# Patient Record
Sex: Male | Born: 1980 | Race: Black or African American | Hispanic: No | Marital: Single | Smoking: Current some day smoker
Health system: Southern US, Community
[De-identification: ages and names within clinical notes are randomized; demographics above are authoritative.]

## PROBLEM LIST (undated history)

## (undated) DIAGNOSIS — F32A Depression, unspecified: Secondary | ICD-10-CM

## (undated) DIAGNOSIS — F419 Anxiety disorder, unspecified: Secondary | ICD-10-CM

## (undated) DIAGNOSIS — J45909 Unspecified asthma, uncomplicated: Secondary | ICD-10-CM

## (undated) DIAGNOSIS — F329 Major depressive disorder, single episode, unspecified: Secondary | ICD-10-CM

## (undated) DIAGNOSIS — K529 Noninfective gastroenteritis and colitis, unspecified: Secondary | ICD-10-CM

## (undated) DIAGNOSIS — K5140 Inflammatory polyps of colon without complications: Principal | ICD-10-CM

## (undated) DIAGNOSIS — R109 Unspecified abdominal pain: Secondary | ICD-10-CM

---
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
## 2000-12-14 ENCOUNTER — Encounter: Payer: Self-pay | Admitting: Emergency Medicine

## 2000-12-14 ENCOUNTER — Emergency Department (HOSPITAL_COMMUNITY): Admission: EM | Admit: 2000-12-14 | Discharge: 2000-12-14 | Payer: Self-pay | Admitting: Emergency Medicine

## 2002-05-08 ENCOUNTER — Encounter: Payer: Self-pay | Admitting: Emergency Medicine

## 2002-05-08 ENCOUNTER — Encounter: Payer: Self-pay | Admitting: Internal Medicine

## 2002-05-08 ENCOUNTER — Inpatient Hospital Stay (HOSPITAL_COMMUNITY): Admission: EM | Admit: 2002-05-08 | Discharge: 2002-05-10 | Payer: Self-pay | Admitting: Emergency Medicine

## 2002-05-09 ENCOUNTER — Encounter: Payer: Self-pay | Admitting: Cardiology

## 2002-05-13 ENCOUNTER — Emergency Department (HOSPITAL_COMMUNITY): Admission: EM | Admit: 2002-05-13 | Discharge: 2002-05-14 | Payer: Self-pay | Admitting: Emergency Medicine

## 2002-05-14 ENCOUNTER — Encounter: Payer: Self-pay | Admitting: Emergency Medicine

## 2003-03-29 ENCOUNTER — Emergency Department (HOSPITAL_COMMUNITY): Admission: EM | Admit: 2003-03-29 | Discharge: 2003-03-29 | Payer: Self-pay | Admitting: Emergency Medicine

## 2003-04-02 ENCOUNTER — Emergency Department (HOSPITAL_COMMUNITY): Admission: EM | Admit: 2003-04-02 | Discharge: 2003-04-02 | Payer: Self-pay | Admitting: *Deleted

## 2003-10-30 ENCOUNTER — Emergency Department (HOSPITAL_COMMUNITY): Admission: EM | Admit: 2003-10-30 | Discharge: 2003-10-30 | Payer: Self-pay | Admitting: Emergency Medicine

## 2004-03-18 ENCOUNTER — Emergency Department (HOSPITAL_COMMUNITY): Admission: EM | Admit: 2004-03-18 | Discharge: 2004-03-18 | Payer: Self-pay | Admitting: Emergency Medicine

## 2004-05-13 ENCOUNTER — Emergency Department (HOSPITAL_COMMUNITY): Admission: EM | Admit: 2004-05-13 | Discharge: 2004-05-14 | Payer: Self-pay | Admitting: Emergency Medicine

## 2008-05-26 ENCOUNTER — Emergency Department (HOSPITAL_COMMUNITY): Admission: EM | Admit: 2008-05-26 | Discharge: 2008-05-26 | Payer: Self-pay | Admitting: Emergency Medicine

## 2010-06-04 LAB — URINALYSIS, ROUTINE W REFLEX MICROSCOPIC
Glucose, UA: NEGATIVE mg/dL
Hgb urine dipstick: NEGATIVE
Ketones, ur: >80 mg/dL — AB
Leukocytes, UA: NEGATIVE
Protein, ur: 100 mg/dL — AB
pH: 6 (ref 5.0–8.0)

## 2010-06-04 LAB — URINE MICROSCOPIC-ADD ON

## 2010-07-11 NOTE — Consult Note (Signed)
Mike Greer, Mike Greer                          ACCOUNT NO.:  [id number]   MEDICAL RECORD NO.:  [id number]                   PATIENT TYPE:  INP   LOCATION:  2013                                 FACILITY:  [facility]   PHYSICIAN:  Salvadore Farber, M.D. [facility]         DATE OF BIRTH:  Jan 05, 1981   DATE OF CONSULTATION:  05/08/2002  DATE OF DISCHARGE:                                   CONSULTATION   REFERRING PHYSICIAN:  [facility].   CARDIOLOGIST:  None.   PRIMARY CARE DOCTOR:  None.   CHIEF COMPLAINT:  Chest pain, syncope.   HISTORY OF PRESENT ILLNESS:  The patient is a 30 year old gentleman without  prior history of cardiovascular disease.  He has no prior history of syncope  or presyncope.  On the day of admission, the patient smoked crack cocaine at  approximately 11:30 a.m.  Approximately 11 p.m., he was lying in bed  watching TV  He arose to go to the kitchen, took three steps and felt his  vision go black.  He fell to the floor without trauma.  The patient's mother  was in the room and he says that he could hear her voice throughout the  episode.  Within a minute, she was able to help him arise.  Following this,  he had up to 9/10 pain in his left arm radiating to his left leg.  This was  quite atypical for him; it has now resolved.   PAST MEDICAL HISTORY:  Occasional asthma for which he does not take  medications.   ALLERGIES:  No known drug allergies.   MEDICATIONS:  None.   SOCIAL HISTORY:  The patient lives in [place] with his parents.  He works  at the [employer].  He smoked one pack a day for eight years.  He  uses marijuana daily and cocaine on occasion.  Occasional alcohol use.   FAMILY HISTORY:  Father recently had a stroke.  Details are unavailable.  Paternal grandfather also had a stroke in his later years.  The patient  denies any family history of premature death or arrhythmia, however, he is  not clear on a lot of the details of the  family history.   REVIEW OF SYSTEMS:  Recent URI, otherwise, negative in detail except as  above.   PHYSICAL EXAMINATION:  GENERAL:  He is a well-appearing man in no distress  with a heart rate of 66, blood pressure 140/80, oxygen saturation of 100% on  room air, temperature of 98.3.  NECK:  He has no jugular venous distention.  LUNGS:  Lungs are clear to auscultation and percussion bilaterally.  CARDIAC:  He has a nondisplaced point of maximal cardiac impulse.  There is  a regular rate and rhythm with a physiologically split S2.  There is no  murmur, rub, or gallop.  ABDOMEN:  The abdomen is soft, nondistended and nontender.  There is  no  hepatosplenomegaly.  Bowel sounds are normal.   LABORATORY STUDIES:  Laboratory studies are remarkable for initial potassium  of 2.3, subsequently corrected to 3.4, sodium 138, creatinine 0.8, glucose  92, hematocrit 39, platelets 198,000, CK-MB 1.3, then 0.3, troponin I 0.01,  then 0.02.   Electrocardiogram demonstrates normal sinus rhythm with normal axis and  normal P wave and QRS morphology.  There is marked prolongation of the Q-T  interval with a corrected Q-T interval as long as 600 msec.   IMPRESSION/RECOMMENDATIONS:  Patient with syncope in the setting of chest  pain with cocaine use, extreme prolongation of the Q-T interval and profound  hypokalemia.   1. Syncope:  Potential etiologies include orthostatic syncope with     dehydration due to alcohol use with exacerbation upon arising from bed.     More concerning are the possibilities of a malignant arrhythmia in the     setting of his markedly prolonged Q-T interval and his hypokalemia.  We     will continue to observe.  We will check echocardiogram tomorrow and     repeat electrocardiogram for further assessment of the Q-T interval.  We     will discuss with electrophysiology.  2. Hypokalemia:  It has now corrected.  Etiology is unclear.  Potential     etiologies include  hyperaldosteronism.  We will continue to observe     closely.  With no current or history of hypertension, hyperaldosteronism     is relatively unlikely but not impossible.   Thank you for this interesting consult.                                               Salvadore Farber, M.D. [facility]    WED/MEDQ  D:  05/08/2002  T:  05/09/2002  Job:  [id number]

## 2010-07-11 NOTE — Discharge Summary (Signed)
Mike Greer, Mike Greer                          ACCOUNT NO.:  [id number]   MEDICAL RECORD NO.:  [id number]                   PATIENT TYPE:  INP   LOCATION:  2013                                 FACILITY:  [facility]   PHYSICIAN:  Thayer Headings, M.D.               DATE OF BIRTH:  11/02/1980   DATE OF ADMISSION:  DATE OF DISCHARGE:  05/10/2002                                 DISCHARGE SUMMARY   DISCHARGE DIAGNOSES:  1. Chest pain in the setting of cocaine abuse.  2. Syncope and prolonged QT interval.  3. Pyuria with moderately increased white blood cell count.   DISCHARGE MEDICATIONS:  None.   DISPOSITION:  The patient was discharged to home in good condition on May 10, 2002.   FOLLOW UP:  He will have followup in the [facility] Internal  Medicine outpatient clinic on Thursday, May 18, 2002, at 2:30 p.m. He will  also see Dr. Graciela Husbands at [facility] on May 30, 2002, at 10:45 a.m.   PROCEDURES:  1. Noncontrast head CT scan which was negative for any acute bleed or     intracranial pathology.  2. A flecainide challenge test to rule out Brugada syndrome.   CONSULTS:  1. Dr. Samule Ohm of [facility] Cardiology.  2. Dr. Graciela Husbands of the [facility] Electrophysiology Service.   HISTORY OF PRESENT ILLNESS:  The patient is a 30 year old African American  male with cardiac risk factors only of current tobacco use, who presents to  [facility] Emergency Department with approximately a 10 hour  history of central chest pain, starting at around 3 to 4 p.m. on the evening  prior to  admission while he was  watching television. His chest pain was  associated with shortness of breath, nausea and sweating. He also stated  that he subsequently developed headache, left-sided numbness and weakness  and had a brief episode of syncope during which time he was able to hear his  mother calling him but he could not rise from the floor. He noticed that  when he awoke  he fell down again and  hit his head and had another brief  episode of loss of consciousness. The patient describes his chest pain as  nonpleuritic. He also states that his symptoms began after smoking a  marijuana cigarette which contained crack cocaine on the day of admission.   PAST MEDICAL HISTORY:  His past medical history is significant only for  asthma, but he is not currently on any inhalers.   PHYSICAL EXAMINATION:  VITAL SIGNS:  At the time of admission his pulse was  72, his blood pressure was 138/79, temperature 99.2, respiratory rate 24 and  he was oxygenating at a saturation of 98% on room air.  His physical examination was entirely within normal limits including his  neurologic examination which was remarkable for the patient being awake,  alert, oriented  x3. His cranial nerves II through XII grossly intact. His  sensation was intact and his strength was intact, even on his left side  which he subjectively described numbness and weakness.   LABORATORY DATA:  An electrocardiogram was performed which revealed normal  sinus rhythm with a rate of 61 beats per minute, an axis which was within  normal limits, PR interval 130, QRS interval of 96 and a corrected QT  interval of 413. There were T-wave inversions in V1 through V3 and there was  a Q-wave in lead AVL. A chest x-ray revealed no acute disease.   CBC:  White count 14.0 with an ANC of 9.9, hemoglobin 13.5, platelets 198.  Basic metabolic profile on admission was remarkable for a potassium of 2.8.  A urine drug screen was positive for cocaine and THC, and a urinalysis  showed a specific gravity of 1.038, 15 ketones, a small number of  leukocytes, 7 to 10 white blood cells, 0 to 2 red cells and rare bacteria.   HOSPITAL COURSE BY PROBLEM:  PROBLEM #1, CHEST PAIN OCCURRING IN THE SETTING  OF COCAINE: The patient's chest pain as stated was felt secondary to cocaine  induced vasospasm. He was admitted to telemetry and   monitored overnight and  ruled out for myocardial infarction with serial enzymes. The patient was not  placed on a beta blocker given his concurrent use of cocaine, however, he  was started on aspirin. He was given Ativan and Toradol as needed for pain  and then he was treated with oxygen by nasal cannula as needed to improve  his O2 saturation.   On the morning of  hospital day #2 the patient was still having  chest pain,  however, at this point he had ruled out for myocardial infarction. However,  given the fact that he was still having  chest pain, it was felt that a  cardiology consultation would be desirable, especially given his EKG  changes. However, later in the day on hospital day #2 the patient was  entirely without chest pain, shortness of breath or nausea and no further  workup for ischemia was pursued.   PROBLEM #2, SYNCOPE AND PROLONGED QT INTERVAL:  At the time of the  cardiology consultation the cardiologists were much more concerned with the  patient's history of syncope and on their review of his electrocardiogram  they felt that the QT interval was prolonged. They felt that the patient was  at risk for sudden death and that his syncopal episode may have represented  a serious arrhythmia.   They felt that it would be best to perform an echocardiogram to evaluate for  structural heart disease. This was scheduled to be performed on hospital day  #2, however, due to scheduling conflicts, this was not able to be done and  subsequently the patient became very impatient and threatened to leave  against medical advice without having  his echocardiogram performed.  However, it was stressed to the patient the seriousness of his condition and  the fact that if he were to leave the hospital he would be at risk for  sudden death, since the etiology of his syncope and prolonged QT interval  were not known.  After much discussion the patient agreed to stay and have the  echocardiogram  performed that night as well as being read by the cardiologist that night.  This was done and it revealed a completely  normal study. There was no  structural abnormality.  In further consultation with the cardiology and electrophysiology services,  they felt that the patient would be best served by having a flecainide  challenge test performed on the morning of hospital day #3 to rule out  Brugada syndrome. This was discussed with the patient and he agreed to stay  until at least noon on hospital day #3 to have this further test performed.   This was performed on the morning of hospital day #3 and was interpreted by  the cardiology service as negative for the Brugada syndrome. At this point  they felt that the patient was stable for discharge from their standpoint  with followup with Dr. Graciela Husbands in approximately 2 weeks.   PROBLEM #3, DRUG ABUSE: The patient had a positive urine drug screen for  cocaine and  marijuana. As such a case manager was consulted and the patient  was  strongly encouraged to abstain from further drug use. It was explained  to him the significance of drug use in terms of his current medical  situation and that cocaine use could be fatal and was, in fact, likely the  reason for his current hospitalization. The social work case Production designer, theatre/television/film  provided information to the patient regarding substance abuse  counseling  and cessation and the patient seemed genuinely sincere in following up on  this as an outpatient.   PROBLEM #4, TOBACCO ABUSE:  The patient was counseled regarding his  cigarette use on the morning of hospital day #3 by the smoking cessation  counselor, who provided the patient with tools to assist with smoking  cessation and discussed the risk factors and dangers of smoking with the  patient.   PROBLEM #5, PYURIA WITH LEUKOCYTOSIS:  The patient denied any sort of  symptoms related to his genitourinary system. He remained afebrile   throughout the hospital course. A urine culture was sent as well as a urine  probe for gonococcus and Chlamydia, however, these results are not back at  the time of dictation and the time of hospital discharge. We will follow  these up and go over the results of these with the patient at his hospital  followup visit and treat as appropriate.   PROBLEM #6, HEADACHE, LEFT-SIDED NUMBNESS AND WEAKNESS AND LOSS OF  CONSCIOUSNESS FOLLOWING  HEAD TRAUMA:  The patient had a completely normal  neurologic examination. However, given the fact that he did describe a  history of falling  and hitting his head and then losing consciousness again  afterwards, it was felt that a head CT scan will be desirable to help rule  out any sort of either epidural or subdural hematoma, as well as to  investigate the possibility of intracranial hemorrhage or infarct given his  cocaine use. A head CT scan was completely negative without finding of hemorrhage, brain edema or mass effect. On the morning of hospital day #2  the patient was no longer describing any symptoms of headache, left-sided  numbness or weakness.   Laboratory studies available at the time of discharge include: Sodium 143,  potassium 3.5, chloride 112, bicarbonate 26, glucose 91, BUN 9, creatinine  0.8, calcium 8.6, magnesium 2.2. White blood cell count 11.1, hemoglobin  12.9, hematocrit 37.6, platelets 179.      Thayer Headings, M.D.                     Thayer Headings, M.D.    BM/MEDQ  D:  05/10/2002  T:  05/12/2002  Job:  [id number]  cc:   Internal Medicine Outpatient Clinic [facility]   Duke Salvia, M.D. [facility]

## 2011-02-12 ENCOUNTER — Emergency Department (HOSPITAL_COMMUNITY)
Admission: EM | Admit: 2011-02-12 | Discharge: 2011-02-12 | Disposition: A | Discharge status: Home / Self Care | Payer: Self-pay | Attending: Emergency Medicine | Admitting: Emergency Medicine

## 2011-02-12 ENCOUNTER — Encounter: Payer: Self-pay | Admitting: *Deleted

## 2011-02-12 ENCOUNTER — Other Ambulatory Visit: Payer: Self-pay

## 2011-02-12 ENCOUNTER — Emergency Department (HOSPITAL_COMMUNITY): Payer: Self-pay

## 2011-02-12 DIAGNOSIS — F17200 Nicotine dependence, unspecified, uncomplicated: Secondary | ICD-10-CM | POA: Insufficient documentation

## 2011-02-12 DIAGNOSIS — R05 Cough: Secondary | ICD-10-CM | POA: Insufficient documentation

## 2011-02-12 DIAGNOSIS — R059 Cough, unspecified: Secondary | ICD-10-CM | POA: Insufficient documentation

## 2011-02-12 DIAGNOSIS — R062 Wheezing: Secondary | ICD-10-CM | POA: Insufficient documentation

## 2011-02-12 DIAGNOSIS — J40 Bronchitis, not specified as acute or chronic: Secondary | ICD-10-CM | POA: Insufficient documentation

## 2011-02-12 DIAGNOSIS — R079 Chest pain, unspecified: Secondary | ICD-10-CM | POA: Insufficient documentation

## 2011-02-12 LAB — DIFFERENTIAL
Basophils Absolute: 0 10*3/uL (ref 0.0–0.1)
Basophils Relative: 0 % (ref 0–1)
Eosinophils Absolute: 0.1 10*3/uL (ref 0.0–0.7)
Eosinophils Relative: 1 % (ref 0–5)
Lymphocytes Relative: 19 % (ref 12–46)
Lymphs Abs: 2.8 10*3/uL (ref 0.7–4.0)
Monocytes Absolute: 0.7 10*3/uL (ref 0.1–1.0)
Monocytes Relative: 5 % (ref 3–12)
Neutro Abs: 10.9 10*3/uL — ABNORMAL HIGH (ref 1.7–7.7)
Neutrophils Relative %: 75 % (ref 43–77)

## 2011-02-12 LAB — POCT I-STAT TROPONIN I
Troponin i, poc: 0.01 ng/mL (ref 0.00–0.08)
Troponin i, poc: 0.02 ng/mL (ref 0.00–0.08)

## 2011-02-12 LAB — POCT I-STAT, CHEM 8
BUN: 9 mg/dL (ref 6–23)
Calcium, Ion: 1.14 mmol/L (ref 1.12–1.32)
HCT: 45 % (ref 39.0–52.0)
TCO2: 26 mmol/L (ref 0–100)

## 2011-02-12 LAB — CBC
HCT: 42.2 % (ref 39.0–52.0)
MCHC: 35.1 g/dL (ref 30.0–36.0)
MCV: 94.4 fL (ref 78.0–100.0)
RDW: 12.8 % (ref 11.5–15.5)

## 2011-02-12 MED ORDER — ALBUTEROL SULFATE HFA 108 (90 BASE) MCG/ACT IN AERS: 1.0000 | INHALATION_SPRAY | Freq: Four times a day (QID) | RESPIRATORY_TRACT | Status: DC | PRN

## 2011-02-12 MED ORDER — DOXYCYCLINE HYCLATE 100 MG PO CAPS: 100.0000 mg | ORAL_CAPSULE | Freq: Two times a day (BID) | ORAL | Status: AC

## 2011-02-12 MED ORDER — PREDNISONE 50 MG PO TABS: 50.0000 mg | ORAL_TABLET | Freq: Every day | ORAL | Status: DC

## 2011-02-12 NOTE — ED Provider Notes (Signed)
History     CSN: [id number] Arrival date & time: 02/12/2011  3:14 AM   First MD Initiated Contact with Patient 02/12/11 0408      Chief Complaint  Patient presents with  . Chest Pain    (Consider location/radiation/quality/duration/timing/severity/associated sxs/prior treatment) Patient is a 30 y.o. male presenting with chest pain. The history is provided by the patient. No language interpreter was used.  Chest Pain The chest pain began 3 - 5 days ago. Chest pain occurs intermittently. The chest pain is unchanged. The pain is associated with coughing. At its most intense, the pain is at 8/10. The pain is currently at 8/10. The severity of the pain is severe. The quality of the pain is described as stabbing. The pain does not radiate. Exacerbated by: coughing and palpation. Primary symptoms include cough and wheezing. Pertinent negatives for primary symptoms include no fever, no fatigue, no syncope, no shortness of breath, no palpitations, no abdominal pain, no nausea, no vomiting, no dizziness and no altered mental status.  Pertinent negatives for associated symptoms include no claudication, no diaphoresis, no lower extremity edema, no near-syncope, no numbness, no orthopnea, no paroxysmal nocturnal dyspnea and no weakness. He tried nothing for the symptoms. Risk factors include male gender.  Pertinent negatives for past medical history include no aneurysm, no aortic aneurysm, no Marfan's syndrome, no MI, no mitral valve prolapse, no pacemaker, no PE and no PVD.  Pertinent negatives for family medical history include: family history of aortic dissection.  Procedure history is negative for cardiac catheterization, echocardiogram, persantine thallium, stress echo, stress thallium and exercise treadmill test.   Patient states he told EMS there was a lump in his throat and point to his larynx and states he did not notice it was there before.  He denies saying he had weakness or numbness and must  be woken from a sound sleep to answer questions  History reviewed. No pertinent past medical history.  History reviewed. No pertinent past surgical history.  Family History  Problem Relation Age of Onset  . Heart attack Father     History  Substance Use Topics  . Smoking status: Current Everyday Smoker -- 2.0 packs/day  . Smokeless tobacco: Never Used  . Alcohol Use: 1.2 oz/week    2 Cans of beer per week      Review of Systems  Constitutional: Negative for fever, diaphoresis and fatigue.  HENT: Negative for facial swelling, sneezing, mouth sores, neck stiffness and postnasal drip.   Eyes: Negative for photophobia.  Respiratory: Positive for cough and wheezing. Negative for shortness of breath and stridor.   Cardiovascular: Positive for chest pain. Negative for palpitations, orthopnea, claudication, syncope and near-syncope.  Gastrointestinal: Negative for nausea, vomiting and abdominal pain.  Genitourinary: Negative for difficulty urinating.  Musculoskeletal: Negative for arthralgias.  Skin: Negative for color change.  Neurological: Negative for dizziness, weakness and numbness.  Hematological: Negative.   Psychiatric/Behavioral: Negative.  Negative for altered mental status.    Allergies  Review of patient's allergies indicates no known allergies.  Home Medications   Current Outpatient Rx  Name Route Sig Dispense Refill  . ASPIRIN 81 MG PO TABS Oral Take 324 mg by mouth daily.      Marland Kitchen NITROGLYCERIN 0.4 MG SL SUBL Sublingual Place 0.4 mg under the tongue once.        BP 134/76  Temp(Src) 97.9 F (36.6 C) (Oral)  Resp 20  Ht 5\' 11"  (1.803 m)  Wt 205 lb (92.987 kg)  BMI  28.59 kg/m2  SpO2 99%  Physical Exam  Constitutional: He is oriented to person, place, and time. He appears well-developed and well-nourished. No distress.  HENT:  Head: Normocephalic and atraumatic.  Mouth/Throat: Oropharynx is clear and moist. No oropharyngeal exudate.  Eyes: EOM are  normal. Pupils are equal, round, and reactive to light.  Neck: Normal range of motion. Neck supple. No JVD present. No tracheal deviation present. No thyromegaly present.  Cardiovascular: Normal rate and regular rhythm.   Pulmonary/Chest: Effort normal. He has wheezes. He exhibits no tenderness.  Abdominal: Soft. Bowel sounds are normal.  Musculoskeletal: Normal range of motion. He exhibits no edema.  Lymphadenopathy:    He has no cervical adenopathy.  Neurological: He is alert and oriented to person, place, and time. He has normal strength and normal reflexes. He displays no tremor and normal reflexes. No cranial nerve deficit or sensory deficit. He exhibits abnormal muscle tone. He displays no Babinski's sign on the right side.  Reflex Scores:      Tricep reflexes are 2+ on the right side and 2+ on the left side.      Bicep reflexes are 2+ on the right side and 2+ on the left side.      Brachioradialis reflexes are 2+ on the right side and 2+ on the left side.      Patellar reflexes are 2+ on the right side and 2+ on the left side.      Achilles reflexes are 2+ on the right side and 2+ on the left side. Skin: Skin is warm and dry. He is not diaphoretic.  Psychiatric: Thought content normal.  5/5 strength in all 4 extremities and sensation intact to all nerve distributions  ED Course  Procedures (including critical care time)  Labs Reviewed  CBC - Abnormal; Notable for the following:    WBC 14.6 (*)    All other components within normal limits  DIFFERENTIAL - Abnormal; Notable for the following:    Neutro Abs 10.9 (*)    All other components within normal limits  POCT I-STAT, CHEM 8  POCT I-STAT TROPONIN I  POCT I-STAT TROPONIN I  I-STAT TROPONIN I  I-STAT, CHEM 8  I-STAT TROPONIN I   Dg Chest 2 View  02/12/2011  *RADIOLOGY REPORT*  Clinical Data: Mid chest pain  CHEST - 2 VIEW  Comparison: None.  Findings: Minimal interstitial prominence. Lungs are otherwise clear. No pleural  effusion or pneumothorax. The cardiomediastinal contours are within normal limits. The visualized bones and soft tissues are without significant appreciable abnormality.  IMPRESSION: Minimal interstitial prominence without focal consolidation.  Original Report Authenticated By: Waneta Martins, M.D.     No diagnosis found.  PERC negative: no long car trips or plane trips, no swelling or pain in the lower extremities, not sedentary.  Highly doubt PE   MDM   Date: 02/12/2011  Rate:62  Rhythm: normal sinus rhythm  QRS Axis: normal  Intervals: normal  ST/T Wave abnormalities: normal  Conduction Disutrbances:none  Narrative Interpretation: grouped beating  Old EKG Reviewed: none available     Patient told to return for weakness numbness difficulty swallowing chest pain shortness of breath or any concerns and to follow up for outpatient stress test.  Patient verbalizes understanding and agrees to follow up     Wilford Merryfield Smitty Cords, MD 02/12/11 1324

## 2011-02-12 NOTE — ED Notes (Signed)
No neuro deficits, pt stated understanding of discharge instructions

## 2011-02-12 NOTE — ED Notes (Signed)
Pt arrived via [facility] with multiple complaints. Pt States intermittent CP that started 3-4 days ago and woke him from sleep tonight. Also complains of golf ball size lump in throat and right side weakness in right hand

## 2011-02-12 NOTE — ED Notes (Signed)
PT c/o intermittent CP over last few days. This morning CP woke him up. Non radiating mid chest. Pt had 324 ASA, nitro x 1 administered by EMS with no relief

## 2011-02-15 ENCOUNTER — Emergency Department (HOSPITAL_COMMUNITY)
Admission: EM | Admit: 2011-02-15 | Discharge: 2011-02-15 | Disposition: A | Discharge status: Home / Self Care | Payer: Self-pay | Attending: Emergency Medicine | Admitting: Emergency Medicine

## 2011-02-15 ENCOUNTER — Other Ambulatory Visit: Payer: Self-pay

## 2011-02-15 ENCOUNTER — Encounter (HOSPITAL_COMMUNITY): Payer: Self-pay | Admitting: *Deleted

## 2011-02-15 DIAGNOSIS — R05 Cough: Secondary | ICD-10-CM | POA: Insufficient documentation

## 2011-02-15 DIAGNOSIS — R079 Chest pain, unspecified: Secondary | ICD-10-CM | POA: Insufficient documentation

## 2011-02-15 DIAGNOSIS — R059 Cough, unspecified: Secondary | ICD-10-CM | POA: Insufficient documentation

## 2011-02-15 DIAGNOSIS — F17200 Nicotine dependence, unspecified, uncomplicated: Secondary | ICD-10-CM | POA: Insufficient documentation

## 2011-02-15 DIAGNOSIS — R0602 Shortness of breath: Secondary | ICD-10-CM | POA: Insufficient documentation

## 2011-02-15 LAB — BASIC METABOLIC PANEL
CO2: 26 mEq/L (ref 19–32)
Chloride: 99 mEq/L (ref 96–112)
GFR calc Af Amer: >90 mL/min (ref >90)
Potassium: 3 mEq/L — ABNORMAL LOW (ref 3.5–5.1)

## 2011-02-15 LAB — DIFFERENTIAL
Basophils Absolute: 0 10*3/uL (ref 0.0–0.1)
Basophils Relative: 0 % (ref 0–1)
Lymphocytes Relative: 13 % (ref 12–46)
Neutro Abs: 10.8 10*3/uL — ABNORMAL HIGH (ref 1.7–7.7)
Neutrophils Relative %: 82 % — ABNORMAL HIGH (ref 43–77)

## 2011-02-15 LAB — TROPONIN I: Troponin I: <0.3 ng/mL (ref <0.30)

## 2011-02-15 LAB — CBC
Hemoglobin: 15.7 g/dL (ref 13.0–17.0)
MCHC: 35.3 g/dL (ref 30.0–36.0)
RDW: 12.7 % (ref 11.5–15.5)
WBC: 13.2 10*3/uL — ABNORMAL HIGH (ref 4.0–10.5)

## 2011-02-15 NOTE — ED Notes (Signed)
Patient given discharge instructions, information and diet order. Patient states that they adequately understand discharge information given and to return to ED if symptoms return or worsen.

## 2011-02-15 NOTE — ED Notes (Signed)
Bed:WA08<BR> Expected date:<BR> Expected time:<BR> Means of arrival:<BR> Comments:<BR> EMS

## 2011-02-15 NOTE — ED Provider Notes (Signed)
History     CSN: [id number]  Arrival date & time 02/15/11  0707   First MD Initiated Contact with Patient 02/15/11 [phone]      Chief Complaint  Patient presents with  . Chest Pain    respiratory illness    (Consider location/radiation/quality/duration/timing/severity/associated sxs/prior treatment) Patient is a 30 y.o. male presenting with chest pain. The history is provided by the patient.  Chest Pain Chest pain occurs constantly. The pain is associated with breathing. The severity of the pain is moderate. The quality of the pain is described as sharp. The pain radiates to the right arm. Primary symptoms include shortness of breath and cough. Pertinent negatives for primary symptoms include no fever.  The cough is non-productive.    patient states she's been having chest pain for at least the past week. He has been coughing a lot with this. Patient denies any history of heart or lung disease. He is concerned however because his father had a stroke. Patient states this pain in his right chest and goes into his right arm and he feels like it locks up. He was seen in the emergency room 3 days ago and had a negative workup with the exception of possible infiltrate in the chest x-ray. Patient was prescribed medications including doxycycline. He has not been able to pick up his medications.  History reviewed. No pertinent past medical history.  History reviewed. No pertinent past surgical history.  Family History  Problem Relation Age of Onset  . Heart attack Father     History  Substance Use Topics  . Smoking status: Current Everyday Smoker -- 2.0 packs/day  . Smokeless tobacco: Never Used  . Alcohol Use: 1.2 oz/week    2 Cans of beer per week      Review of Systems  Constitutional: Negative for fever.  Respiratory: Positive for cough and shortness of breath.   Cardiovascular: Positive for chest pain.  All other systems reviewed and are negative.    Allergies  Review of  patient's allergies indicates no known allergies.  Home Medications   Current Outpatient Rx  Name Route Sig Dispense Refill  . ASPIRIN 81 MG PO TABS Oral Take 324 mg by mouth daily.      Marland Kitchen DOXYCYCLINE HYCLATE 100 MG PO CAPS Oral Take 1 capsule (100 mg total) by mouth 2 (two) times daily. 14 capsule 0  . NITROGLYCERIN 0.4 MG SL SUBL Sublingual Place 0.4 mg under the tongue once.      Marland Kitchen PREDNISONE 50 MG PO TABS Oral Take 1 tablet (50 mg total) by mouth daily. 5 tablet 0    There were no vitals taken for this visit.  Physical Exam  Nursing note and vitals reviewed. Constitutional: He appears well-developed and well-nourished. No distress.  HENT:  Head: Normocephalic and atraumatic.  Right Ear: External ear normal.  Left Ear: External ear normal.  Eyes: Conjunctivae are normal. Right eye exhibits no discharge. Left eye exhibits no discharge. No scleral icterus.  Neck: Neck supple. No tracheal deviation present.  Cardiovascular: Normal rate, regular rhythm and intact distal pulses.   Pulmonary/Chest: Effort normal and breath sounds normal. No stridor. No respiratory distress. He has no wheezes. He has no rales.  Abdominal: Soft. Bowel sounds are normal. He exhibits no distension. There is no tenderness. There is no rebound and no guarding.  Musculoskeletal: He exhibits no edema and no tenderness.  Neurological: He is alert. He has normal strength. No sensory deficit. Cranial nerve deficit:  no  gross defecits noted. He exhibits normal muscle tone. He displays no seizure activity. Coordination normal.  Skin: Skin is warm and dry. No rash noted.  Psychiatric: He has a normal mood and affect.    ED Course  Procedures (including critical care time)  Date: 02/15/2011  Rate: 68  Rhythm: normal sinus rhythm  QRS Axis: normal  Intervals: normal  ST/T Wave abnormalities: normal  Conduction Disutrbances:nonspecific intraventricular conduction delay  Narrative Interpretation:   Old EKG  Reviewed: none available   Labs Reviewed  CBC - Abnormal; Notable for the following:    WBC 13.2 (*)    All other components within normal limits  DIFFERENTIAL - Abnormal; Notable for the following:    Neutrophils Relative 82 (*)    Neutro Abs 10.8 (*)    All other components within normal limits  BASIC METABOLIC PANEL - Abnormal; Notable for the following:    Potassium 3.0 (*)    Glucose, Bld 67 (*)    All other components within normal limits  TROPONIN I   No results found.   Dx. Chest pain   MDM  Pt previously seen for similar complaints.  Questionable PNA on prior CXR.  Pt without risk factors for PE.  Symptoms not typical for cardiac disease.  Encouraged him to take the antibiotics.  Recc follow up with PCP.        Celene Kras, MD 02/15/11 [phone]

## 2011-02-15 NOTE — ED Notes (Signed)
Per EMS: Pt diagnosed with pneumonia 3 days ago and having chest wall pain. Pt not taking any medications at this time.

## 2013-05-12 ENCOUNTER — Encounter (HOSPITAL_COMMUNITY): Payer: Self-pay | Admitting: Emergency Medicine

## 2013-05-12 ENCOUNTER — Emergency Department (HOSPITAL_COMMUNITY)
Admission: EM | Admit: 2013-05-12 | Discharge: 2013-05-12 | Disposition: A | Discharge status: Home / Self Care | Payer: Self-pay | Attending: Emergency Medicine | Admitting: Emergency Medicine

## 2013-05-12 ENCOUNTER — Emergency Department (HOSPITAL_COMMUNITY): Payer: Self-pay

## 2013-05-12 DIAGNOSIS — F17200 Nicotine dependence, unspecified, uncomplicated: Secondary | ICD-10-CM | POA: Insufficient documentation

## 2013-05-12 DIAGNOSIS — K921 Melena: Secondary | ICD-10-CM | POA: Insufficient documentation

## 2013-05-12 DIAGNOSIS — K5289 Other specified noninfective gastroenteritis and colitis: Secondary | ICD-10-CM | POA: Insufficient documentation

## 2013-05-12 DIAGNOSIS — K529 Noninfective gastroenteritis and colitis, unspecified: Secondary | ICD-10-CM

## 2013-05-12 DIAGNOSIS — J45909 Unspecified asthma, uncomplicated: Secondary | ICD-10-CM | POA: Insufficient documentation

## 2013-05-12 HISTORY — DX: Unspecified asthma, uncomplicated: J45.909

## 2013-05-12 LAB — URINALYSIS, ROUTINE W REFLEX MICROSCOPIC
Glucose, UA: NEGATIVE mg/dL
HGB URINE DIPSTICK: NEGATIVE
Ketones, ur: 15 mg/dL — AB
LEUKOCYTES UA: NEGATIVE
Nitrite: NEGATIVE
PROTEIN: 100 mg/dL — AB
SPECIFIC GRAVITY, URINE: 1.042 — AB (ref 1.005–1.030)
UROBILINOGEN UA: 1 mg/dL (ref 0.0–1.0)
pH: 5.5 (ref 5.0–8.0)

## 2013-05-12 LAB — COMPREHENSIVE METABOLIC PANEL
ALT: 12 U/L (ref 0–53)
AST: 15 U/L (ref 0–37)
Albumin: 3.6 g/dL (ref 3.5–5.2)
Alkaline Phosphatase: 83 U/L (ref 39–117)
BUN: 14 mg/dL (ref 6–23)
CALCIUM: 9.7 mg/dL (ref 8.4–10.5)
CO2: 27 mEq/L (ref 19–32)
CREATININE: 1.01 mg/dL (ref 0.50–1.35)
Chloride: 97 mEq/L (ref 96–112)
GLUCOSE: 137 mg/dL — AB (ref 70–99)
Potassium: 3.4 mEq/L — ABNORMAL LOW (ref 3.7–5.3)
SODIUM: 138 meq/L (ref 137–147)
TOTAL PROTEIN: 8.5 g/dL — AB (ref 6.0–8.3)
Total Bilirubin: 0.5 mg/dL (ref 0.3–1.2)

## 2013-05-12 LAB — CBC WITH DIFFERENTIAL/PLATELET
BASOS ABS: 0.1 10*3/uL (ref 0.0–0.1)
Basophils Relative: 0 % (ref 0–1)
EOS ABS: 0.3 10*3/uL (ref 0.0–0.7)
EOS PCT: 3 % (ref 0–5)
HEMATOCRIT: 44.1 % (ref 39.0–52.0)
Hemoglobin: 15.3 g/dL (ref 13.0–17.0)
Lymphocytes Relative: 19 % (ref 12–46)
Lymphs Abs: 2.3 10*3/uL (ref 0.7–4.0)
MCH: 30.8 pg (ref 26.0–34.0)
MCHC: 34.7 g/dL (ref 30.0–36.0)
MCV: 88.9 fL (ref 78.0–100.0)
MONO ABS: 0.5 10*3/uL (ref 0.1–1.0)
Monocytes Relative: 4 % (ref 3–12)
Neutro Abs: 9 10*3/uL — ABNORMAL HIGH (ref 1.7–7.7)
Neutrophils Relative %: 74 % (ref 43–77)
PLATELETS: 393 10*3/uL (ref 150–400)
RBC: 4.96 MIL/uL (ref 4.22–5.81)
RDW: 12.7 % (ref 11.5–15.5)
WBC: 12.2 10*3/uL — ABNORMAL HIGH (ref 4.0–10.5)

## 2013-05-12 LAB — URINE MICROSCOPIC-ADD ON

## 2013-05-12 LAB — LIPASE, BLOOD: Lipase: 44 U/L (ref 11–59)

## 2013-05-12 MED ORDER — SODIUM CHLORIDE 0.9 % IV BOLUS (SEPSIS)
1000.0000 mL | Freq: Once | INTRAVENOUS | Status: AC
  Administered 2013-05-12: 1000 mL via INTRAVENOUS

## 2013-05-12 MED ORDER — IOHEXOL 300 MG/ML  SOLN
100.0000 mL | Freq: Once | INTRAMUSCULAR | Status: AC | PRN
  Administered 2013-05-12: 100 mL via INTRAVENOUS

## 2013-05-12 MED ORDER — ONDANSETRON HCL 4 MG/2ML IJ SOLN
4.0000 mg | Freq: Once | INTRAMUSCULAR | Status: AC
  Administered 2013-05-12: 4 mg via INTRAVENOUS
  Filled 2013-05-12: qty 2

## 2013-05-12 MED ORDER — CIPROFLOXACIN HCL 500 MG PO TABS: 500.0000 mg | ORAL_TABLET | Freq: Two times a day (BID) | ORAL | Status: DC

## 2013-05-12 MED ORDER — IOHEXOL 300 MG/ML  SOLN
25.0000 mL | Freq: Once | INTRAMUSCULAR | Status: AC | PRN
  Administered 2013-05-12: 25 mL via ORAL

## 2013-05-12 MED ORDER — PROMETHAZINE HCL 25 MG PO TABS: 25.0000 mg | ORAL_TABLET | Freq: Four times a day (QID) | ORAL | Status: DC | PRN

## 2013-05-12 MED ORDER — METRONIDAZOLE 500 MG PO TABS: 500.0000 mg | ORAL_TABLET | Freq: Three times a day (TID) | ORAL | Status: DC

## 2013-05-12 NOTE — ED Notes (Signed)
Pt placed on BP cuff and continuous pulse ox by this RN

## 2013-05-12 NOTE — ED Notes (Signed)
MD Pickering at bedside.  

## 2013-05-12 NOTE — ED Notes (Signed)
Patient transported to CT 

## 2013-05-12 NOTE — ED Provider Notes (Signed)
CSN: [id number]     Arrival date & time 05/12/13  0540 History   First MD Initiated Contact with Patient 05/12/13 [phone]     Chief Complaint  Patient presents with  . Abdominal Pain     (Consider location/radiation/quality/duration/timing/severity/associated sxs/prior Treatment) Patient is a 33 y.o. male presenting with abdominal pain. The history is provided by the patient.  Abdominal Pain Associated symptoms: chills, diarrhea, nausea and vomiting   Associated symptoms: no chest pain and no shortness of breath    patient states he has had abdominal pain for the last week or 2. States it is worse on the right side. States he has had blood in the toilet bowl and while wiping. He both states that he has had normal stools with it and that he has had diarrhea for 2 weeks. He's had nausea and some vomiting. The vomiting did not have any blood. He states he said chills but does not know this had a fever. No sick contacts. He does not have a history of abdominal problems. Patient also states that he has had mucus in the stool.  Past Medical History  Diagnosis Date  . Asthma    History reviewed. No pertinent past surgical history. Family History  Problem Relation Age of Onset  . Heart attack Father    History  Substance Use Topics  . Smoking status: Current Every Day Smoker -- 2.00 packs/day    Types: Cigarettes  . Smokeless tobacco: Never Used  . Alcohol Use: 1.2 oz/week    2 Cans of beer per week    Review of Systems  Constitutional: Positive for chills and appetite change. Negative for activity change.  Eyes: Negative for pain.  Respiratory: Negative for chest tightness and shortness of breath.   Cardiovascular: Negative for chest pain and leg swelling.  Gastrointestinal: Positive for nausea, vomiting, abdominal pain, diarrhea and blood in stool.  Genitourinary: Negative for flank pain.  Musculoskeletal: Negative for back pain and neck stiffness.  Skin: Negative for rash.   Neurological: Negative for weakness, numbness and headaches.  Psychiatric/Behavioral: Negative for behavioral problems.      Allergies  Review of patient's allergies indicates no known allergies.  Home Medications   Current Outpatient Rx  Name  Route  Sig  Dispense  Refill  . ibuprofen (ADVIL,MOTRIN) 200 MG tablet   Oral   Take 400-600 mg by mouth every 6 (six) hours as needed for headache, mild pain or moderate pain.           BP 140/79  Pulse 78  Temp(Src) 98.2 F (36.8 C) (Oral)  Resp 16  Ht 5\' 11"  (1.803 m)  Wt 193 lb (87.544 kg)  BMI 26.93 kg/m2  SpO2 99% Physical Exam  Nursing note and vitals reviewed. Constitutional: He is oriented to person, place, and time. He appears well-developed and well-nourished.  HENT:  Head: Normocephalic and atraumatic.  Eyes: EOM are normal. Pupils are equal, round, and reactive to light.  Neck: Normal range of motion. Neck supple.  Cardiovascular: Normal rate, regular rhythm and normal heart sounds.   No murmur heard. Pulmonary/Chest: Effort normal and breath sounds normal.  Abdominal: Soft. He exhibits no distension and no mass. There is tenderness. There is no rebound and no guarding.  Right lower quadrant tenderness without rebound or guarding.  Musculoskeletal: Normal range of motion. He exhibits no edema.  Neurological: He is alert and oriented to person, place, and time. No cranial nerve deficit.  Skin: Skin is warm and dry.  Psychiatric: He has a normal mood and affect.    ED Course  Procedures (including critical care time) Labs Review Labs Reviewed  CBC WITH DIFFERENTIAL - Abnormal; Notable for the following:    WBC 12.2 (*)    Neutro Abs 9.0 (*)    All other components within normal limits  COMPREHENSIVE METABOLIC PANEL - Abnormal; Notable for the following:    Potassium 3.4 (*)    Glucose, Bld 137 (*)    Total Protein 8.5 (*)    All other components within normal limits  URINALYSIS, ROUTINE W REFLEX  MICROSCOPIC - Abnormal; Notable for the following:    Color, Urine AMBER (*)    APPearance TURBID (*)    Specific Gravity, Urine 1.042 (*)    Bilirubin Urine SMALL (*)    Ketones, ur 15 (*)    Protein, ur 100 (*)    All other components within normal limits  URINE MICROSCOPIC-ADD ON - Abnormal; Notable for the following:    Crystals CA OXALATE CRYSTALS (*)    All other components within normal limits  LIPASE, BLOOD      EKG Interpretation None      MDM   Final diagnoses:  Colitis    Patient presented with abdominal pain and some blood in the stool. CT scan done and showed a right-sided colitis. Will give antibiotics and have followup with gastroenterology.    Jasper Riling. Alvino Chapel, [place] 05/15/13 [phone]

## 2013-05-12 NOTE — ED Notes (Signed)
Pt discharged home, pt alert and ambulatory upon discharge, 3 new RX prescribed, pt verbalizes understanding of discharge instructions, pt drove self home, no narcotics given in ED

## 2013-05-12 NOTE — ED Notes (Addendum)
Pt presents with Right side abd pain, nausea and diarrhea x1 week. Pt reports increas pain to his RLQ region and vomiting x1 today. Pt reports having loose stool daily x1 week, some days 2 times a day but mostly 1 time a day Pt with NAD, resting, alert and oriented x4. p

## 2013-05-12 NOTE — Discharge Instructions (Signed)
Colitis °Colitis is inflammation of the colon. Colitis can be a short-term or long-standing (chronic) illness. Crohn's disease and ulcerative colitis are 2 types of colitis which are chronic. They usually require lifelong treatment. °CAUSES  °There are many different causes of colitis, including: °· Viruses. °· Germs (bacteria). °· Medicine reactions. °SYMPTOMS  °· Diarrhea. °· Intestinal bleeding. °· Pain. °· Fever. °· Throwing up (vomiting). °· Tiredness (fatigue). °· Weight loss. °· Bowel blockage. °DIAGNOSIS  °The diagnosis of colitis is based on examination and stool or blood tests. X-rays, CT scan, and colonoscopy may also be needed. °TREATMENT  °Treatment may include: °· Fluids given through the vein (intravenously). °· Bowel rest (nothing to eat or drink for a period of time). °· Medicine for pain and diarrhea. °· Medicines (antibiotics) that kill germs. °· Cortisone medicines. °· Surgery. °HOME CARE INSTRUCTIONS  °· Get plenty of rest. °· Drink enough water and fluids to keep your urine clear or pale yellow. °· Eat a well-balanced diet. °· Call your caregiver for follow-up as recommended. °SEEK IMMEDIATE MEDICAL CARE IF:  °· You develop chills. °· You have an oral temperature above 102° F (38.9° C), not controlled by medicine. °· You have extreme weakness, fainting, or dehydration. °· You have repeated vomiting. °· You develop severe belly (abdominal) pain or are passing bloody or tarry stools. °MAKE SURE YOU:  °· Understand these instructions. °· Will watch your condition. °· Will get help right away if you are not doing well or get worse. °Document Released: 03/19/2004 Document Revised: 05/04/2011 Document Reviewed: 06/14/2009 °[employer]® Patient Information ©2014 [employer]. ° °

## 2013-05-12 NOTE — ED Notes (Signed)
Pt. reports RLQ pain with nausea , vomitting and bloody stools onset yesterday ,denies fever or chills.

## 2013-05-14 ENCOUNTER — Emergency Department (HOSPITAL_COMMUNITY): Payer: No Typology Code available for payment source

## 2013-05-14 ENCOUNTER — Emergency Department (HOSPITAL_COMMUNITY)
Admission: EM | Admit: 2013-05-14 | Discharge: 2013-05-15 | Disposition: A | Discharge status: Home / Self Care | Payer: No Typology Code available for payment source | Attending: Emergency Medicine | Admitting: Emergency Medicine

## 2013-05-14 ENCOUNTER — Encounter (HOSPITAL_COMMUNITY): Payer: Self-pay | Admitting: Emergency Medicine

## 2013-05-14 DIAGNOSIS — M549 Dorsalgia, unspecified: Secondary | ICD-10-CM

## 2013-05-14 DIAGNOSIS — K529 Noninfective gastroenteritis and colitis, unspecified: Secondary | ICD-10-CM

## 2013-05-14 DIAGNOSIS — R109 Unspecified abdominal pain: Secondary | ICD-10-CM | POA: Insufficient documentation

## 2013-05-14 DIAGNOSIS — R51 Headache: Secondary | ICD-10-CM | POA: Insufficient documentation

## 2013-05-14 DIAGNOSIS — K5289 Other specified noninfective gastroenteritis and colitis: Secondary | ICD-10-CM | POA: Insufficient documentation

## 2013-05-14 DIAGNOSIS — J45909 Unspecified asthma, uncomplicated: Secondary | ICD-10-CM | POA: Insufficient documentation

## 2013-05-14 DIAGNOSIS — M542 Cervicalgia: Secondary | ICD-10-CM

## 2013-05-14 DIAGNOSIS — F17200 Nicotine dependence, unspecified, uncomplicated: Secondary | ICD-10-CM | POA: Insufficient documentation

## 2013-05-14 DIAGNOSIS — Y9389 Activity, other specified: Secondary | ICD-10-CM | POA: Insufficient documentation

## 2013-05-14 DIAGNOSIS — R079 Chest pain, unspecified: Secondary | ICD-10-CM

## 2013-05-14 DIAGNOSIS — T07XXXA Unspecified multiple injuries, initial encounter: Secondary | ICD-10-CM | POA: Insufficient documentation

## 2013-05-14 LAB — CBC WITH DIFFERENTIAL/PLATELET
Basophils Absolute: 0 10*3/uL (ref 0.0–0.1)
Basophils Relative: 0 % (ref 0–1)
EOS PCT: 4 % (ref 0–5)
Eosinophils Absolute: 0.5 10*3/uL (ref 0.0–0.7)
HEMATOCRIT: 38 % — AB (ref 39.0–52.0)
Hemoglobin: 13.3 g/dL (ref 13.0–17.0)
Lymphocytes Relative: 17 % (ref 12–46)
Lymphs Abs: 1.8 10*3/uL (ref 0.7–4.0)
MCH: 31.1 pg (ref 26.0–34.0)
MCHC: 35 g/dL (ref 30.0–36.0)
MCV: 89 fL (ref 78.0–100.0)
MONOS PCT: 7 % (ref 3–12)
Monocytes Absolute: 0.8 10*3/uL (ref 0.1–1.0)
NEUTROS ABS: 7.7 10*3/uL (ref 1.7–7.7)
Neutrophils Relative %: 71 % (ref 43–77)
Platelets: 356 10*3/uL (ref 150–400)
RBC: 4.27 MIL/uL (ref 4.22–5.81)
RDW: 12.8 % (ref 11.5–15.5)
WBC: 10.7 10*3/uL — ABNORMAL HIGH (ref 4.0–10.5)

## 2013-05-14 LAB — COMPREHENSIVE METABOLIC PANEL
ALBUMIN: 3.1 g/dL — AB (ref 3.5–5.2)
ALT: 13 U/L (ref 0–53)
AST: 17 U/L (ref 0–37)
Alkaline Phosphatase: 75 U/L (ref 39–117)
BILIRUBIN TOTAL: 0.2 mg/dL — AB (ref 0.3–1.2)
BUN: 16 mg/dL (ref 6–23)
CHLORIDE: 101 meq/L (ref 96–112)
CO2: 27 mEq/L (ref 19–32)
CREATININE: 1.13 mg/dL (ref 0.50–1.35)
Calcium: 9 mg/dL (ref 8.4–10.5)
GFR calc Af Amer: >90 mL/min (ref >90)
GFR, EST NON AFRICAN AMERICAN: 85 mL/min — AB (ref >90)
Glucose, Bld: 103 mg/dL — ABNORMAL HIGH (ref 70–99)
Potassium: 3.9 mEq/L (ref 3.7–5.3)
Sodium: 140 mEq/L (ref 137–147)
Total Protein: 7.2 g/dL (ref 6.0–8.3)

## 2013-05-14 MED ORDER — ACETAMINOPHEN 325 MG PO TABS
650.0000 mg | ORAL_TABLET | Freq: Once | ORAL | Status: AC
  Administered 2013-05-15: 650 mg via ORAL
  Filled 2013-05-14: qty 2

## 2013-05-14 MED ORDER — CIPROFLOXACIN HCL 500 MG PO TABS
500.0000 mg | ORAL_TABLET | Freq: Once | ORAL | Status: AC
  Administered 2013-05-14: 500 mg via ORAL
  Filled 2013-05-14: qty 1

## 2013-05-14 MED ORDER — METRONIDAZOLE 500 MG PO TABS
500.0000 mg | ORAL_TABLET | Freq: Once | ORAL | Status: AC
  Administered 2013-05-14: 500 mg via ORAL
  Filled 2013-05-14: qty 1
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
## 2013-05-14 NOTE — ED Notes (Signed)
Pt up to br, steady on feet, moving well.

## 2013-05-14 NOTE — ED Notes (Signed)
Pt returned from radiology.

## 2013-05-14 NOTE — ED Notes (Signed)
Lab into room to draw blood.

## 2013-05-14 NOTE — ED Notes (Signed)
Pt reports no change in status.  C/O stomach pain 9/10.

## 2013-05-14 NOTE — ED Notes (Signed)
Patient transported to X-ray 
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
## 2013-05-15 NOTE — Discharge Instructions (Signed)
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 [place]   [place]  [place] Department  [place]  [phone]    Behavioral Health Resources in the Community: Intensive Outpatient Programs Organization         Address  Phone  Notes  [place]. [address] [place], [place] [phone]   [facility] Outpatient [address], [place], [place]   ADS: [facility] [address] [place], [place]   [place] [address],  [place], [place] or [phone]   Substance Abuse Resources Organization         Address  Phone  Notes  [facility]  [phone]   [place]  [phone]   [facility] [place]   Chinita Pester  [phone]   Residential & Outpatient Substance Abuse Program  [phone]   Psychological Services Organization         Address  Phone  Notes  [facility] [place]  [place]  [phone]   [place] [address] [place] or [phone]    Mobile Crisis Teams Organization         Address  Phone  Notes  [facility], Mobile Crisis Care Unit  [phone]   [facility]  [address] [place], [place]   [place] [address], [place] [phone]    Self-Help/Support Groups Organization         Address  Phone             Notes  [place] [facility] [place] - variety of support groups  [place] Call for more information  Narcotics Anonymous (NA),  [facility] [address], [employer] [place]  2 meetings at this location   Special educational needs teacher         Address  Phone  Notes  [facility] [place],    [place]  [place]  [address], [place], [place], [place]  [place], [place] [phone] Admissions: 8am-3pm M-F  Incentives Substance [place] [address],    [place], [place] [phone]   [facility] [address] [place], [place], [place]   [facility] [address],  [place], [place]   [facility] - Intensive Outpatient [address] Kristeen Mans [address], [place], [place]   [facility] [place]  [place], [place] [phone] or [phone]   [facility] ([facility]) [address] [place], [place] Accepts Medicaid  [facility] [place] [address]  [place] [phone] Substance Abuse/Addiction Treatment   [facility] Organization         Address  Phone  Notes  [facility]  [phone]   Domenic Schwab, PhD [address] Arlis Porta [place], [place]   [phone] or [phone]   [place], [place] [phone]   [facility] [address], [place], [place] [phone] Insurance/Medicaid/sponsorship through [facility] and Families [address], [address]                                    [place], [place] [phone] [place] [address], [place] [phone]    Dr. Adele Schilder  [phone]   [facility] Dept. 1) [address] [place] 2) [place] 3)  [place] [address], [place] [phone]  [phone]    [place] [phone] or [phone] (After Hours)

## 2013-05-27 ENCOUNTER — Encounter (HOSPITAL_COMMUNITY): Payer: Self-pay | Admitting: Emergency Medicine

## 2013-05-27 ENCOUNTER — Emergency Department (HOSPITAL_COMMUNITY)
Admission: EM | Admit: 2013-05-27 | Discharge: 2013-05-28 | Disposition: A | Discharge status: Home / Self Care | Payer: No Typology Code available for payment source | Attending: Emergency Medicine | Admitting: Emergency Medicine

## 2013-05-27 DIAGNOSIS — K529 Noninfective gastroenteritis and colitis, unspecified: Secondary | ICD-10-CM

## 2013-05-27 DIAGNOSIS — J45909 Unspecified asthma, uncomplicated: Secondary | ICD-10-CM | POA: Insufficient documentation

## 2013-05-27 DIAGNOSIS — R209 Unspecified disturbances of skin sensation: Secondary | ICD-10-CM | POA: Insufficient documentation

## 2013-05-27 DIAGNOSIS — K5289 Other specified noninfective gastroenteritis and colitis: Secondary | ICD-10-CM | POA: Insufficient documentation

## 2013-05-27 DIAGNOSIS — F17200 Nicotine dependence, unspecified, uncomplicated: Secondary | ICD-10-CM | POA: Insufficient documentation
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
## 2013-05-27 NOTE — ED Notes (Signed)
Bed: GY18 Expected date:  Expected time:  Means of arrival:  Comments: EMS/feet numb/RUQ pain

## 2013-05-28 LAB — COMPREHENSIVE METABOLIC PANEL
ALT: 12 U/L (ref 0–53)
AST: 18 U/L (ref 0–37)
Albumin: 3.9 g/dL (ref 3.5–5.2)
Alkaline Phosphatase: 105 U/L (ref 39–117)
BUN: 13 mg/dL (ref 6–23)
CO2: 25 meq/L (ref 19–32)
Calcium: 10.1 mg/dL (ref 8.4–10.5)
Chloride: 96 mEq/L (ref 96–112)
Creatinine, Ser: 1.01 mg/dL (ref 0.50–1.35)
GLUCOSE: 86 mg/dL (ref 70–99)
POTASSIUM: 3.2 meq/L — AB (ref 3.7–5.3)
SODIUM: 136 meq/L — AB (ref 137–147)
Total Bilirubin: 0.9 mg/dL (ref 0.3–1.2)
Total Protein: 8.7 g/dL — ABNORMAL HIGH (ref 6.0–8.3)

## 2013-05-28 LAB — URINALYSIS, ROUTINE W REFLEX MICROSCOPIC
Glucose, UA: NEGATIVE mg/dL
HGB URINE DIPSTICK: NEGATIVE
Ketones, ur: 15 mg/dL — AB
Leukocytes, UA: NEGATIVE
Nitrite: NEGATIVE
PROTEIN: 100 mg/dL — AB
SPECIFIC GRAVITY, URINE: 1.044 — AB (ref 1.005–1.030)
UROBILINOGEN UA: 1 mg/dL (ref 0.0–1.0)
pH: 5.5 (ref 5.0–8.0)

## 2013-05-28 LAB — CBC WITH DIFFERENTIAL/PLATELET
Basophils Absolute: 0 10*3/uL (ref 0.0–0.1)
Basophils Relative: 0 % (ref 0–1)
Eosinophils Absolute: 0.5 10*3/uL (ref 0.0–0.7)
Eosinophils Relative: 3 % (ref 0–5)
HCT: 41.2 % (ref 39.0–52.0)
Hemoglobin: 14 g/dL (ref 13.0–17.0)
LYMPHS ABS: 2.1 10*3/uL (ref 0.7–4.0)
LYMPHS PCT: 13 % (ref 12–46)
MCH: 30.4 pg (ref 26.0–34.0)
MCHC: 34 g/dL (ref 30.0–36.0)
MCV: 89.4 fL (ref 78.0–100.0)
Monocytes Absolute: 0.9 10*3/uL (ref 0.1–1.0)
Monocytes Relative: 6 % (ref 3–12)
NEUTROS PCT: 77 % (ref 43–77)
Neutro Abs: 12.1 10*3/uL — ABNORMAL HIGH (ref 1.7–7.7)
PLATELETS: 237 10*3/uL (ref 150–400)
RBC: 4.61 MIL/uL (ref 4.22–5.81)
RDW: 13.1 % (ref 11.5–15.5)
WBC: 15.6 10*3/uL — ABNORMAL HIGH (ref 4.0–10.5)

## 2013-05-28 LAB — LIPASE, BLOOD: Lipase: 31 U/L (ref 11–59)

## 2013-05-28 LAB — URINE MICROSCOPIC-ADD ON

## 2013-05-28 MED ORDER — CIPROFLOXACIN HCL 500 MG PO TABS: 500.0000 mg | ORAL_TABLET | Freq: Two times a day (BID) | ORAL | Status: DC

## 2013-05-28 MED ORDER — METRONIDAZOLE 500 MG PO TABS: 500.0000 mg | ORAL_TABLET | Freq: Two times a day (BID) | ORAL | Status: DC

## 2013-05-28 MED ORDER — SODIUM CHLORIDE 0.9 % IV BOLUS (SEPSIS)
1000.0000 mL | Freq: Once | INTRAVENOUS | Status: AC
  Administered 2013-05-28: 1000 mL via INTRAVENOUS

## 2013-05-28 NOTE — ED Provider Notes (Signed)
CSN: [id number]     Arrival date & time 05/27/13  2345 History   First MD Initiated Contact with Patient 05/27/13 2351     Chief Complaint  Patient presents with  . Abdominal Pain  . feet tingling      (Consider location/radiation/quality/duration/timing/severity/associated sxs/prior Treatment) HPI Comments: Pt with hx of asthma, comes in with cc of abd pain and tingling in his feet. Reports pain is his abd x 3 days, right sided. The pain is non radiating, and is sharp. With this he has diarrhea, no emesis. No blood in his stools. Pt has no uti like sx, no hx of abd surgeries. Pt also has bilateral feet tingling. No pain. Tingling for the past few days. Of note, patient is intoxicated, has alcohol in his breath and is extremely sleepy. Admits to etoh use this evening.  Patient is a 33 y.o. male presenting with abdominal pain. The history is provided by the patient and medical records.  Abdominal Pain Associated symptoms: no chest pain, no cough, no dysuria and no shortness of breath     Past Medical History  Diagnosis Date  . Asthma    History reviewed. No pertinent past surgical history. Family History  Problem Relation Age of Onset  . Heart attack Father    History  Substance Use Topics  . Smoking status: Current Every Day Smoker -- 2.00 packs/day    Types: Cigarettes  . Smokeless tobacco: Never Used  . Alcohol Use: 1.2 oz/week    2 Cans of beer per week    Review of Systems  Constitutional: Negative for activity change and appetite change.  Respiratory: Negative for cough and shortness of breath.   Cardiovascular: Negative for chest pain.  Gastrointestinal: Positive for abdominal pain.  Genitourinary: Negative for dysuria.  Neurological: Positive for numbness. Negative for seizures.  All other systems reviewed and are negative.      Allergies  Review of patient's allergies indicates no known allergies.  Home Medications   Current Outpatient Rx  Name  Route   Sig  Dispense  Refill  . ibuprofen (ADVIL,MOTRIN) 200 MG tablet   Oral   Take 400-600 mg by mouth every 6 (six) hours as needed for headache, mild pain or moderate pain.           BP 137/84  Pulse 84  Temp(Src) 98.2 F (36.8 C) (Oral)  Resp 14  SpO2 98% Physical Exam  Nursing note and vitals reviewed. Constitutional: He is oriented to person, place, and time. He appears well-developed.  HENT:  Head: Normocephalic and atraumatic.  Eyes: Conjunctivae and EOM are normal. Pupils are equal, round, and reactive to light.  Neck: Normal range of motion. Neck supple.  Cardiovascular: Normal rate and regular rhythm.   Pulmonary/Chest: Effort normal and breath sounds normal.  Abdominal: Soft. Bowel sounds are normal. He exhibits no distension. There is tenderness. There is no rebound and no guarding.  Neurological: He is alert and oriented to person, place, and time.  Able to discriminate between sharp and dull over the legs, and has 2+ and equal patellar reflex  Skin: Skin is warm.    ED Course  Procedures (including critical care time) Labs Review Labs Reviewed  CBC WITH DIFFERENTIAL  COMPREHENSIVE METABOLIC PANEL  LIPASE, BLOOD  URINALYSIS, ROUTINE W REFLEX MICROSCOPIC   Imaging Review No results found.   EKG Interpretation None      MDM   Final diagnoses:  None   Pt comes in with cc of  abd pain and numbness to his feet.  He had a CT abd done in March:  IMPRESSION:  There is proximal at ascending colon colitis. The terminal ileum as  it enters the colon is also inflamed. The remainder of the ileum and  small bowel appears normal. The appendix is normal in size without  inflammatory change. Question somewhat atypical presensation of  Crohn's disease versus infectious colitis and extreme terminal  ileitis as most likely differential considerations in this  circumstance. No frank abscess.   His exam is not impressive. Right sided tenderness, with no peritoneal  signs. Filed Vitals:   05/27/13 2353  BP: 137/84  Pulse: 84  Temp: 98.2 F (36.8 C)  Resp: 14    Not sure what is going on with his feet numbness, and tingling that are subjective. Labs ordered.   Pt is very sleepy, and it was hard to get full hx and exam - as he kept falling asleep. Admits to alcohol use. Will get basic labs and reassess.  9:52 AM This morning, patient reassessed. He states that he never filled his antibiotics. I told him cipro is $4, flagyl shouldn't be very expensive. He will fill the rx. He has been advised to see GI as well based on his CT.  Patient is clinically sober. He is talking coherently, gait is normal, and is demonstrating rational thought process. We shall discharge him shortly, and we have discussed the warning signs of alcohol withdrawal with him verbally, and the information will be provided with the discharge instructions as well.    Varney Biles, MD 05/28/13 [phone]

## 2013-05-28 NOTE — Discharge Instructions (Signed)
Your abdominal pain is likely from an infection. Take the antibiotics as prescribed. The ciproflagyl is on the $4 plan. WE RECOMMEND THAT YOU SEE GI DOCTORS BASED ON THE LAST CT RESULTS.  The GI doctor information has been provided.   Colitis Colitis is inflammation of the colon. Colitis can be a short-term or long-standing (chronic) illness. Crohn's disease and ulcerative colitis are 2 types of colitis which are chronic. They usually require lifelong treatment. CAUSES  There are many different causes of colitis, including:  Viruses.  Germs (bacteria).  Medicine reactions. SYMPTOMS   Diarrhea.  Intestinal bleeding.  Pain.  Fever.  Throwing up (vomiting).  Tiredness (fatigue).  Weight loss.  Bowel blockage. DIAGNOSIS  The diagnosis of colitis is based on examination and stool or blood tests. X-rays, CT scan, and colonoscopy may also be needed. TREATMENT  Treatment may include:  Fluids given through the vein (intravenously).  Bowel rest (nothing to eat or drink for a period of time).  Medicine for pain and diarrhea.  Medicines (antibiotics) that kill germs.  Cortisone medicines.  Surgery. HOME CARE INSTRUCTIONS   Get plenty of rest.  Drink enough water and fluids to keep your urine clear or pale yellow.  Eat a well-balanced diet.  Call your caregiver for follow-up as recommended. SEEK IMMEDIATE MEDICAL CARE IF:   You develop chills.  You have an oral temperature above 102 F (38.9 C), not controlled by medicine.  You have extreme weakness, fainting, or dehydration.  You have repeated vomiting.  You develop severe belly (abdominal) pain or are passing bloody or tarry stools. MAKE SURE YOU:   Understand these instructions.  Will watch your condition.  Will get help right away if you are not doing well or get worse. Document Released: 03/19/2004 Document Revised: 05/04/2011 Document Reviewed: 06/14/2009 [facility] Patient Information 2014  [place], [place].

## 2013-05-28 NOTE — ED Notes (Signed)
Pt declines to answer assessment questions.

## 2013-12-23 ENCOUNTER — Encounter (HOSPITAL_COMMUNITY): Payer: Self-pay | Admitting: Emergency Medicine

## 2013-12-23 ENCOUNTER — Emergency Department (HOSPITAL_COMMUNITY)
Admission: EM | Admit: 2013-12-23 | Discharge: 2013-12-23 | Disposition: A | Discharge status: Home / Self Care | Payer: Self-pay | Attending: Emergency Medicine | Admitting: Emergency Medicine

## 2013-12-23 ENCOUNTER — Emergency Department (HOSPITAL_COMMUNITY): Payer: Self-pay

## 2013-12-23 DIAGNOSIS — J45909 Unspecified asthma, uncomplicated: Secondary | ICD-10-CM | POA: Insufficient documentation

## 2013-12-23 DIAGNOSIS — K625 Hemorrhage of anus and rectum: Secondary | ICD-10-CM | POA: Insufficient documentation

## 2013-12-23 DIAGNOSIS — Z720 Tobacco use: Secondary | ICD-10-CM | POA: Insufficient documentation

## 2013-12-23 DIAGNOSIS — R1031 Right lower quadrant pain: Secondary | ICD-10-CM | POA: Insufficient documentation

## 2013-12-23 DIAGNOSIS — R350 Frequency of micturition: Secondary | ICD-10-CM

## 2013-12-23 LAB — CBC WITH DIFFERENTIAL/PLATELET
Basophils Absolute: 0 10*3/uL (ref 0.0–0.1)
Basophils Relative: 0 % (ref 0–1)
EOS ABS: 0.3 10*3/uL (ref 0.0–0.7)
Eosinophils Relative: 3 % (ref 0–5)
HCT: 39.4 % (ref 39.0–52.0)
HEMOGLOBIN: 12.8 g/dL — AB (ref 13.0–17.0)
LYMPHS ABS: 1.8 10*3/uL (ref 0.7–4.0)
LYMPHS PCT: 16 % (ref 12–46)
MCH: 29.3 pg (ref 26.0–34.0)
MCHC: 32.5 g/dL (ref 30.0–36.0)
MCV: 90.2 fL (ref 78.0–100.0)
MONOS PCT: 8 % (ref 3–12)
Monocytes Absolute: 0.8 10*3/uL (ref 0.1–1.0)
NEUTROS PCT: 73 % (ref 43–77)
Neutro Abs: 7.8 10*3/uL — ABNORMAL HIGH (ref 1.7–7.7)
Platelets: 257 10*3/uL (ref 150–400)
RBC: 4.37 MIL/uL (ref 4.22–5.81)
RDW: 14.2 % (ref 11.5–15.5)
WBC: 10.7 10*3/uL — AB (ref 4.0–10.5)

## 2013-12-23 LAB — URINALYSIS, ROUTINE W REFLEX MICROSCOPIC
BILIRUBIN URINE: NEGATIVE
GLUCOSE, UA: NEGATIVE mg/dL
HGB URINE DIPSTICK: NEGATIVE
Ketones, ur: 15 mg/dL — AB
Leukocytes, UA: NEGATIVE
Nitrite: NEGATIVE
PROTEIN: NEGATIVE mg/dL
Specific Gravity, Urine: 1.031 — ABNORMAL HIGH (ref 1.005–1.030)
Urobilinogen, UA: 0.2 mg/dL (ref 0.0–1.0)
pH: 5.5 (ref 5.0–8.0)

## 2013-12-23 LAB — COMPREHENSIVE METABOLIC PANEL
ALK PHOS: 90 U/L (ref 39–117)
ALT: 17 U/L (ref 0–53)
ANION GAP: 14 (ref 5–15)
AST: 17 U/L (ref 0–37)
Albumin: 3.8 g/dL (ref 3.5–5.2)
BILIRUBIN TOTAL: 0.7 mg/dL (ref 0.3–1.2)
BUN: 9 mg/dL (ref 6–23)
CHLORIDE: 101 meq/L (ref 96–112)
CO2: 25 mEq/L (ref 19–32)
Calcium: 9.5 mg/dL (ref 8.4–10.5)
Creatinine, Ser: 0.84 mg/dL (ref 0.50–1.35)
GLUCOSE: 77 mg/dL (ref 70–99)
POTASSIUM: 3.7 meq/L (ref 3.7–5.3)
Sodium: 140 mEq/L (ref 137–147)
Total Protein: 8 g/dL (ref 6.0–8.3)

## 2013-12-23 LAB — LIPASE, BLOOD: Lipase: 31 U/L (ref 11–59)

## 2013-12-23 MED ORDER — ONDANSETRON 8 MG PO TBDP: 8.0000 mg | ORAL_TABLET | Freq: Three times a day (TID) | ORAL | Status: DC | PRN

## 2013-12-23 MED ORDER — MORPHINE SULFATE 4 MG/ML IJ SOLN
4.0000 mg | Freq: Once | INTRAMUSCULAR | Status: AC
  Administered 2013-12-23: 4 mg via INTRAVENOUS
  Filled 2013-12-23: qty 1

## 2013-12-23 MED ORDER — HYDROCODONE-ACETAMINOPHEN 5-325 MG PO TABS: 1.0000 | ORAL_TABLET | ORAL | Status: DC | PRN

## 2013-12-23 MED ORDER — KETOROLAC TROMETHAMINE 30 MG/ML IJ SOLN
30.0000 mg | Freq: Once | INTRAMUSCULAR | Status: AC
  Administered 2013-12-23: 30 mg via INTRAVENOUS
  Filled 2013-12-23: qty 1

## 2013-12-23 MED ORDER — ONDANSETRON HCL 4 MG/2ML IJ SOLN
4.0000 mg | Freq: Once | INTRAMUSCULAR | Status: AC
  Administered 2013-12-23: 4 mg via INTRAVENOUS
  Filled 2013-12-23: qty 2

## 2013-12-23 NOTE — ED Notes (Signed)
Pt reports "only slight pain; not really bothering me." Pt reports will alert staff if would like something else for pain.

## 2013-12-23 NOTE — ED Notes (Signed)
Pt states he started having severe lower abdominal pain last night and he has bright red blood in his stools and his urine is darker than usual,  Slight nausea

## 2013-12-23 NOTE — ED Notes (Signed)
Unsuccessful IV attempt x2 by this RN. Other staff with attempt with pt return from [place].

## 2013-12-23 NOTE — ED Provider Notes (Signed)
CSN: [id number]     Arrival date & time 12/23/13  0630 History   First MD Initiated Contact with Patient 12/23/13 [phone]     Chief Complaint  Patient presents with  . Abdominal Pain  . Rectal Bleeding  . Nausea      HPI Patient reports developing right-sided crampy abdominal pain this morning.  He also noted a small amount of blood in his stool.  Does report some urinary frequency without dysuria.  No fevers or chills.  Nausea without vomiting.  Reports he had one episode of pain similar to this before.  No history of inflammatory bowel disease.  His pain is moderate in severity at this time   Past Medical History  Diagnosis Date  . Asthma    History reviewed. No pertinent past surgical history. Family History  Problem Relation Age of Onset  . Heart attack Father    History  Substance Use Topics  . Smoking status: Current Every Day Smoker -- 2.00 packs/day    Types: Cigarettes  . Smokeless tobacco: Never Used  . Alcohol Use: 1.2 oz/week    2 Cans of beer per week    Review of Systems  All other systems reviewed and are negative.     Allergies  Review of patient's allergies indicates no known allergies.  Home Medications   Prior to Admission medications   Medication Sig Start Date End Date Taking? Authorizing Provider  ibuprofen (ADVIL,MOTRIN) 200 MG tablet Take 400-600 mg by mouth every 6 (six) hours as needed for headache, mild pain or moderate pain.    Yes Historical Provider, MD   BP 131/84  Pulse 63  Temp(Src) 97.9 F (36.6 C) (Oral)  Resp 18  Ht 5\' 11"  (1.803 m)  Wt 212 lb (96.163 kg)  BMI 29.58 kg/m2  SpO2 100% Physical Exam  Nursing note and vitals reviewed. Constitutional: He is oriented to person, place, and time. He appears well-developed and well-nourished.  HENT:  Head: Normocephalic and atraumatic.  Eyes: EOM are normal.  Neck: Normal range of motion.  Cardiovascular: Normal rate, regular rhythm, normal heart sounds and intact distal pulses.    Pulmonary/Chest: Effort normal and breath sounds normal. No respiratory distress.  Abdominal: Soft. He exhibits no distension.  Mild right-sided abdominal tenderness.  Musculoskeletal: Normal range of motion.  Neurological: He is alert and oriented to person, place, and time.  Skin: Skin is warm and dry.  Psychiatric: He has a normal mood and affect. Judgment normal.    ED Course  Procedures (including critical care time) Labs Review Labs Reviewed  CBC WITH DIFFERENTIAL - Abnormal; Notable for the following:    WBC 10.7 (*)    Hemoglobin 12.8 (*)    Neutro Abs 7.8 (*)    All other components within normal limits  URINALYSIS, ROUTINE W REFLEX MICROSCOPIC - Abnormal; Notable for the following:    Color, Urine AMBER (*)    Specific Gravity, Urine 1.031 (*)    Ketones, ur 15 (*)    All other components within normal limits  COMPREHENSIVE METABOLIC PANEL  LIPASE, BLOOD    Imaging Review Ct Abdomen Pelvis Wo Contrast  12/23/2013   CLINICAL DATA:  Right lower quadrant pain  EXAM: CT ABDOMEN AND PELVIS WITHOUT CONTRAST  TECHNIQUE: Multidetector CT imaging of the abdomen and pelvis was performed following the standard protocol without IV contrast.  COMPARISON:  05/12/2013  FINDINGS: There is wall thickening and inflammatory change involving primarily the terminal ileum, and to a lesser extent  the cecum. Normal appendix. Small inflammatory nodes in the cecal mesenteric. No extraluminal bowel gas. No abscess.  Liver, spleen, pancreas, adrenal glands, and kidneys are within normal limits  Gallbladder sludge  Bladder is decompressed.  Unremarkable prostate.  No free-fluid.  IMPRESSION: Inflammatory change of the terminal ileum [place] come most consistent with Crohn's disease. Also consider infectious colitis. Findings are similar to those seen in March of this year.   Electronically Signed   By: Maryclare Bean M.D.   On: 12/23/2013 08:18  I personally reviewed the imaging tests through PACS system I  reviewed available ER/hospitalization records through the EMR    EKG Interpretation None      MDM   Final diagnoses:  Right sided abdominal pain  Urinary frequency    Likely inflammatory bowel disease.  Small amount of inflammatory changes noted at the terminal ileum  concerning for possible Crohn's disease.  Patient has never seen a GI specialist.  I recommended he follow-up with GI.  I recommended a primary care team as well.  Home with a short course of pain medicine and nausea medicine.  He understands to return to the ER for new or worsening symptoms   Hoy Morn, MD 12/23/13 [phone]

## 2013-12-23 NOTE — ED Notes (Signed)
Graham crackers and ginger ale provided to the patient upon DC. He is alert, oriented, and ambulatory upon DC. He reports he is leaving with ALL belongings he arrived with.

## 2013-12-23 NOTE — Discharge Instructions (Signed)

## 2013-12-23 NOTE — ED Notes (Signed)
Pt reports right side abdominal pain tender to touch starting this morning. Pt states nausea and streaks of blood in formed stool.

## 2014-04-27 ENCOUNTER — Encounter (HOSPITAL_COMMUNITY): Payer: Self-pay | Admitting: Emergency Medicine

## 2014-04-27 ENCOUNTER — Emergency Department (HOSPITAL_COMMUNITY): Payer: Self-pay

## 2014-04-27 ENCOUNTER — Emergency Department (HOSPITAL_COMMUNITY)
Admission: EM | Admit: 2014-04-27 | Discharge: 2014-04-27 | Disposition: A | Discharge status: Home / Self Care | Payer: Self-pay | Attending: Emergency Medicine | Admitting: Emergency Medicine

## 2014-04-27 DIAGNOSIS — N50811 Right testicular pain: Secondary | ICD-10-CM

## 2014-04-27 DIAGNOSIS — Z79899 Other long term (current) drug therapy: Secondary | ICD-10-CM | POA: Insufficient documentation

## 2014-04-27 DIAGNOSIS — J45909 Unspecified asthma, uncomplicated: Secondary | ICD-10-CM | POA: Insufficient documentation

## 2014-04-27 DIAGNOSIS — Z720 Tobacco use: Secondary | ICD-10-CM | POA: Insufficient documentation

## 2014-04-27 DIAGNOSIS — N451 Epididymitis: Secondary | ICD-10-CM | POA: Insufficient documentation

## 2014-04-27 LAB — CBC WITH DIFFERENTIAL/PLATELET
BASOS ABS: 0 10*3/uL (ref 0.0–0.1)
BASOS PCT: 0 % (ref 0–1)
EOS PCT: 2 % (ref 0–5)
Eosinophils Absolute: 0.2 10*3/uL (ref 0.0–0.7)
HCT: 35.6 % — ABNORMAL LOW (ref 39.0–52.0)
Hemoglobin: 12.1 g/dL — ABNORMAL LOW (ref 13.0–17.0)
LYMPHS PCT: 11 % — AB (ref 12–46)
Lymphs Abs: 1.5 10*3/uL (ref 0.7–4.0)
MCH: 30 pg (ref 26.0–34.0)
MCHC: 34 g/dL (ref 30.0–36.0)
MCV: 88.1 fL (ref 78.0–100.0)
Monocytes Absolute: 0.6 10*3/uL (ref 0.1–1.0)
Monocytes Relative: 4 % (ref 3–12)
Neutro Abs: 11.4 10*3/uL — ABNORMAL HIGH (ref 1.7–7.7)
Neutrophils Relative %: 83 % — ABNORMAL HIGH (ref 43–77)
PLATELETS: 344 10*3/uL (ref 150–400)
RBC: 4.04 MIL/uL — AB (ref 4.22–5.81)
RDW: 13.1 % (ref 11.5–15.5)
WBC: 13.7 10*3/uL — ABNORMAL HIGH (ref 4.0–10.5)

## 2014-04-27 LAB — COMPREHENSIVE METABOLIC PANEL
ALBUMIN: 3.4 g/dL — AB (ref 3.5–5.2)
ALK PHOS: 75 U/L (ref 39–117)
ALT: 19 U/L (ref 0–53)
AST: 17 U/L (ref 0–37)
Anion gap: 9 (ref 5–15)
BUN: 6 mg/dL (ref 6–23)
CHLORIDE: 100 mmol/L (ref 96–112)
CO2: 27 mmol/L (ref 19–32)
Calcium: 9.1 mg/dL (ref 8.4–10.5)
Creatinine, Ser: 0.96 mg/dL (ref 0.50–1.35)
GFR calc non Af Amer: >90 mL/min (ref >90)
GLUCOSE: 94 mg/dL (ref 70–99)
POTASSIUM: 2.8 mmol/L — AB (ref 3.5–5.1)
SODIUM: 136 mmol/L (ref 135–145)
TOTAL PROTEIN: 7.5 g/dL (ref 6.0–8.3)
Total Bilirubin: 0.7 mg/dL (ref 0.3–1.2)

## 2014-04-27 MED ORDER — ONDANSETRON HCL 4 MG/2ML IJ SOLN
4.0000 mg | Freq: Once | INTRAMUSCULAR | Status: AC
  Administered 2014-04-27: 4 mg via INTRAVENOUS
  Filled 2014-04-27: qty 2

## 2014-04-27 MED ORDER — DOXYCYCLINE HYCLATE 100 MG PO TABS: 100.0000 mg | ORAL_TABLET | Freq: Two times a day (BID) | ORAL | Status: DC

## 2014-04-27 MED ORDER — CEFTRIAXONE SODIUM 250 MG IJ SOLR
250.0000 mg | Freq: Once | INTRAMUSCULAR | Status: AC
  Administered 2014-04-27: 250 mg via INTRAMUSCULAR
  Filled 2014-04-27: qty 250

## 2014-04-27 MED ORDER — KETOROLAC TROMETHAMINE 30 MG/ML IJ SOLN
30.0000 mg | Freq: Once | INTRAMUSCULAR | Status: AC
  Administered 2014-04-27: 30 mg via INTRAVENOUS
  Filled 2014-04-27: qty 1

## 2014-04-27 MED ORDER — LIDOCAINE HCL (PF) 1 % IJ SOLN
5.0000 mL | Freq: Once | INTRAMUSCULAR | Status: AC
  Administered 2014-04-27: 5 mL
  Filled 2014-04-27: qty 5

## 2014-04-27 MED ORDER — DOXYCYCLINE HYCLATE 100 MG PO TABS
100.0000 mg | ORAL_TABLET | Freq: Once | ORAL | Status: AC
  Administered 2014-04-27: 100 mg via ORAL
  Filled 2014-04-27: qty 1

## 2014-04-27 MED ORDER — MORPHINE SULFATE 4 MG/ML IJ SOLN
4.0000 mg | Freq: Once | INTRAMUSCULAR | Status: AC
  Administered 2014-04-27: 4 mg via INTRAVENOUS
  Filled 2014-04-27: qty 1

## 2014-04-27 NOTE — Discharge Instructions (Signed)
Epididymitis Mike Greer, Take antibiotic as prescribed and follow-up with your primary care physician within 3 days. If symptoms worsen, return to the ED immediately. Thank you.  Epididymitis is a swelling (inflammation) of the epididymis. The epididymis is a cord-like structure along the back part of the testicle. Epididymitis is usually, but not always, caused by infection. This is usually a sudden problem beginning with chills, fever and pain behind the scrotum and in the testicle. There may be swelling and redness of the testicle. DIAGNOSIS  Physical examination will reveal a tender, swollen epididymis. Sometimes, cultures are obtained from the urine or from prostate secretions to help find out if there is an infection or if the cause is a different problem. Sometimes, blood work is performed to see if your white blood cell count is elevated and if a germ (bacterial) or viral infection is present. Using this knowledge, an appropriate medicine which kills germs (antibiotic) can be chosen by your caregiver. A viral infection causing epididymitis will most often go away (resolve) without treatment. HOME CARE INSTRUCTIONS   Hot sitz baths for 20 minutes, 4 times per day, may help relieve pain.  Only take over-the-counter or prescription medicines for pain, discomfort or fever as directed by your caregiver.  Take all medicines, including antibiotics, as directed. Take the antibiotics for the full prescribed length of time even if you are feeling better.  It is very important to keep all follow-up appointments. SEEK IMMEDIATE MEDICAL CARE IF:   You have a fever.  You have pain not relieved with medicines.  You have any worsening of your problems.  Your pain seems to come and go.  You develop pain, redness, and swelling in the scrotum and surrounding areas. MAKE SURE YOU:   Understand these instructions.  Will watch your condition.  Will get help right away if you are not doing well or  get worse. Document Released: 02/07/2000 Document Revised: 05/04/2011 Document Reviewed: 12/27/2008 [facility] Patient Information 2015 [place], [place]. This information is not intended to replace advice given to you by your health care provider. Make sure you discuss any questions you have with your health care provider.
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
## 2014-04-27 NOTE — ED Notes (Signed)
Pt presents with right sided testicle pain after sexual intercourse yesterday- swelling noted to right testicle.  Pt admits to delay in urination, denies painful urination.

## 2014-05-18 ENCOUNTER — Emergency Department (HOSPITAL_COMMUNITY)
Admission: EM | Admit: 2014-05-18 | Discharge: 2014-05-19 | Disposition: A | Discharge status: Home / Self Care | Payer: Self-pay | Attending: Emergency Medicine | Admitting: Emergency Medicine

## 2014-05-18 ENCOUNTER — Encounter (HOSPITAL_COMMUNITY): Payer: Self-pay

## 2014-05-18 ENCOUNTER — Emergency Department (HOSPITAL_COMMUNITY): Payer: Self-pay

## 2014-05-18 DIAGNOSIS — Z720 Tobacco use: Secondary | ICD-10-CM | POA: Insufficient documentation

## 2014-05-18 DIAGNOSIS — R079 Chest pain, unspecified: Secondary | ICD-10-CM | POA: Insufficient documentation

## 2014-05-18 DIAGNOSIS — J45909 Unspecified asthma, uncomplicated: Secondary | ICD-10-CM | POA: Insufficient documentation

## 2014-05-18 LAB — CBC
HEMATOCRIT: 37.8 % — AB (ref 39.0–52.0)
Hemoglobin: 12.3 g/dL — ABNORMAL LOW (ref 13.0–17.0)
MCH: 29.1 pg (ref 26.0–34.0)
MCHC: 32.5 g/dL (ref 30.0–36.0)
MCV: 89.6 fL (ref 78.0–100.0)
Platelets: 395 10*3/uL (ref 150–400)
RBC: 4.22 MIL/uL (ref 4.22–5.81)
RDW: 13.5 % (ref 11.5–15.5)
WBC: 9.9 10*3/uL (ref 4.0–10.5)

## 2014-05-18 LAB — BASIC METABOLIC PANEL
Anion gap: 9 (ref 5–15)
BUN: 13 mg/dL (ref 6–23)
CALCIUM: 9 mg/dL (ref 8.4–10.5)
CHLORIDE: 102 mmol/L (ref 96–112)
CO2: 26 mmol/L (ref 19–32)
CREATININE: 1.09 mg/dL (ref 0.50–1.35)
GFR, EST NON AFRICAN AMERICAN: 88 mL/min — AB (ref >90)
Glucose, Bld: 78 mg/dL (ref 70–99)
Potassium: 3 mmol/L — ABNORMAL LOW (ref 3.5–5.1)
SODIUM: 137 mmol/L (ref 135–145)

## 2014-05-18 LAB — I-STAT TROPONIN, ED: Troponin i, poc: 0 ng/mL (ref 0.00–0.08)

## 2014-05-18 NOTE — ED Notes (Signed)
X-ray at bedside

## 2014-05-18 NOTE — ED Notes (Signed)
Pt arrived EMS. C/O chest pain while in custody for shoplifting.  Pain 8/10.  Pt reports alcohol use and crack/cocaine use "24/7". BP 130/88, P 100,

## 2014-05-18 NOTE — ED Notes (Signed)
Called xray stated patient work order moved and will be completed soon.

## 2014-05-18 NOTE — Discharge Instructions (Signed)
Test showed no evidence of a heart attack. Your potassium was slightly low.

## 2014-05-18 NOTE — ED Provider Notes (Addendum)
CSN: [id number]     Arrival date & time 05/18/14  2059 History   First MD Initiated Contact with Patient 05/18/14 2100     Chief Complaint  Patient presents with  . Chest Pain     (Consider location/radiation/quality/duration/timing/severity/associated sxs/prior Treatment) HPI..... Patient was arrested this evening for shoplifting.   Approximately 1 hour after his arrest, he complained of chest pain. No diaphoresis, nausea, dyspnea. No previous history of cardiac problems. Patient reports alcohol and crack usage. Severity is mild.  Past Medical History  Diagnosis Date  . Asthma    History reviewed. No pertinent past surgical history. Family History  Problem Relation Age of Onset  . Heart attack Father    History  Substance Use Topics  . Smoking status: Current Every Day Smoker -- 2.00 packs/day    Types: Cigarettes  . Smokeless tobacco: Never Used  . Alcohol Use: 1.2 oz/week    2 Cans of beer per week    Review of Systems  All other systems reviewed and are negative.     Allergies  Review of patient's allergies indicates no known allergies.  Home Medications   Prior to Admission medications   Medication Sig Start Date End Date Taking? Authorizing Provider  doxycycline (VIBRA-TABS) 100 MG tablet Take 1 tablet (100 mg total) by mouth 2 (two) times daily. Patient not taking: Reported on 05/18/2014 04/27/14   Everlene Balls, MD  HYDROcodone-acetaminophen (NORCO/VICODIN) 5-325 MG per tablet Take 1 tablet by mouth every 4 (four) hours as needed for moderate pain. Patient not taking: Reported on 05/18/2014 12/23/13   Jola Schmidt, MD  ibuprofen (ADVIL,MOTRIN) 200 MG tablet Take 400-600 mg by mouth every 6 (six) hours as needed for headache, mild pain or moderate pain.     Historical Provider, MD  ondansetron (ZOFRAN ODT) 8 MG disintegrating tablet Take 1 tablet (8 mg total) by mouth every 8 (eight) hours as needed for nausea or vomiting. Patient not taking: Reported on 05/18/2014  12/23/13   Jola Schmidt, MD   BP 130/70 mmHg  Pulse 77  Temp(Src) 98.2 F (36.8 C) (Oral)  Resp 18  Ht 5\' 11"  (1.803 m)  Wt 200 lb (90.719 kg)  BMI 27.91 kg/m2  SpO2 96% Physical Exam  Constitutional: He is oriented to person, place, and time.  Sleeping, no chest pain  HENT:  Head: Normocephalic and atraumatic.  Eyes: Conjunctivae and EOM are normal. Pupils are equal, round, and reactive to light.  Neck: Normal range of motion. Neck supple.  Cardiovascular: Normal rate and regular rhythm.   Pulmonary/Chest: Effort normal and breath sounds normal.  Abdominal: Soft. Bowel sounds are normal.  Musculoskeletal: Normal range of motion.  Neurological: He is alert and oriented to person, place, and time.  Skin: Skin is warm and dry.  Psychiatric: He has a normal mood and affect. His behavior is normal.  Nursing note and vitals reviewed.   ED Course  Procedures (including critical care time) Labs Review Labs Reviewed  CBC - Abnormal; Notable for the following:    Hemoglobin 12.3 (*)    HCT 37.8 (*)    All other components within normal limits  BASIC METABOLIC PANEL - Abnormal; Notable for the following:    Potassium 3.0 (*)    GFR calc non Af Amer 88 (*)    All other components within normal limits  Randolm Idol, ED    Imaging Review Dg Chest Port 1 View  05/18/2014   CLINICAL DATA:  Chest pain.  EXAM: PORTABLE  CHEST - 1 VIEW  COMPARISON:  None.  FINDINGS: The heart size and mediastinal contours are within normal limits. Both lungs are clear. The visualized skeletal structures are unremarkable.  IMPRESSION: No active disease.   Electronically Signed   By: Rolm Baptise M.D.   On: 05/18/2014 23:18     EKG Interpretation   Date/Time:  Friday May 18 2014 21:05:23 EDT Ventricular Rate:  82 PR Interval:  143 QRS Duration: 95 QT Interval:  399 QTC Calculation: 466 R Axis:   33 Text Interpretation:  Sinus rhythm Probable left ventricular hypertrophy  Baseline wander in  lead(s) V5 Confirmed by Deep Bonawitz  MD, Traye Bates ([id number]) on  05/18/2014 10:31:30 PM      MDM   Final diagnoses:  Chest pain, unspecified chest pain type   Screening labs, EKG, troponin all negative. Potassium minimally low.  Patient has been sleeping the entire time in the emergency department    Nat Christen, MD 05/18/14 [place], MD 05/18/14 [phone]

## 2014-12-12 ENCOUNTER — Emergency Department (HOSPITAL_COMMUNITY)
Admission: EM | Admit: 2014-12-12 | Discharge: 2014-12-12 | Disposition: A | Discharge status: Home / Self Care | Payer: Self-pay | Attending: Emergency Medicine | Admitting: Emergency Medicine

## 2014-12-12 ENCOUNTER — Emergency Department (HOSPITAL_COMMUNITY): Payer: Self-pay

## 2014-12-12 ENCOUNTER — Encounter (HOSPITAL_COMMUNITY): Payer: Self-pay | Admitting: *Deleted

## 2014-12-12 DIAGNOSIS — Y998 Other external cause status: Secondary | ICD-10-CM | POA: Insufficient documentation

## 2014-12-12 DIAGNOSIS — Y9289 Other specified places as the place of occurrence of the external cause: Secondary | ICD-10-CM | POA: Insufficient documentation

## 2014-12-12 DIAGNOSIS — S0990XA Unspecified injury of head, initial encounter: Secondary | ICD-10-CM | POA: Insufficient documentation

## 2014-12-12 DIAGNOSIS — J45909 Unspecified asthma, uncomplicated: Secondary | ICD-10-CM | POA: Insufficient documentation

## 2014-12-12 DIAGNOSIS — S6991XA Unspecified injury of right wrist, hand and finger(s), initial encounter: Secondary | ICD-10-CM | POA: Insufficient documentation

## 2014-12-12 DIAGNOSIS — Z720 Tobacco use: Secondary | ICD-10-CM | POA: Insufficient documentation

## 2014-12-12 DIAGNOSIS — S8991XA Unspecified injury of right lower leg, initial encounter: Secondary | ICD-10-CM | POA: Insufficient documentation

## 2014-12-12 DIAGNOSIS — Y9389 Activity, other specified: Secondary | ICD-10-CM | POA: Insufficient documentation
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
## 2014-12-12 NOTE — ED Notes (Signed)
Patient is alert and orientedx4.  Patient was explained discharge instructions and they understood them with no questions.   
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
## 2015-05-09 ENCOUNTER — Encounter (HOSPITAL_COMMUNITY): Payer: Self-pay | Admitting: Nurse Practitioner

## 2015-05-09 ENCOUNTER — Emergency Department (HOSPITAL_COMMUNITY): Payer: Self-pay

## 2015-05-09 ENCOUNTER — Inpatient Hospital Stay (HOSPITAL_COMMUNITY)
Admission: EM | Admit: 2015-05-09 | Discharge: 2015-05-15 | DRG: 392 | Disposition: A | Discharge status: Home / Self Care | Payer: Self-pay | Attending: Internal Medicine | Admitting: Internal Medicine

## 2015-05-09 DIAGNOSIS — K633 Ulcer of intestine: Secondary | ICD-10-CM | POA: Diagnosis present

## 2015-05-09 DIAGNOSIS — K5730 Diverticulosis of large intestine without perforation or abscess without bleeding: Secondary | ICD-10-CM | POA: Diagnosis present

## 2015-05-09 DIAGNOSIS — D127 Benign neoplasm of rectosigmoid junction: Secondary | ICD-10-CM | POA: Diagnosis present

## 2015-05-09 DIAGNOSIS — R17 Unspecified jaundice: Secondary | ICD-10-CM | POA: Diagnosis present

## 2015-05-09 DIAGNOSIS — R1031 Right lower quadrant pain: Secondary | ICD-10-CM

## 2015-05-09 DIAGNOSIS — R740 Nonspecific elevation of levels of transaminase and lactic acid dehydrogenase [LDH]: Secondary | ICD-10-CM | POA: Diagnosis present

## 2015-05-09 DIAGNOSIS — F17210 Nicotine dependence, cigarettes, uncomplicated: Secondary | ICD-10-CM | POA: Diagnosis present

## 2015-05-09 DIAGNOSIS — R7989 Other specified abnormal findings of blood chemistry: Secondary | ICD-10-CM | POA: Insufficient documentation

## 2015-05-09 DIAGNOSIS — A09 Infectious gastroenteritis and colitis, unspecified: Principal | ICD-10-CM | POA: Diagnosis present

## 2015-05-09 DIAGNOSIS — K529 Noninfective gastroenteritis and colitis, unspecified: Secondary | ICD-10-CM | POA: Diagnosis present

## 2015-05-09 DIAGNOSIS — E876 Hypokalemia: Secondary | ICD-10-CM | POA: Diagnosis not present

## 2015-05-09 DIAGNOSIS — R945 Abnormal results of liver function studies: Secondary | ICD-10-CM

## 2015-05-09 DIAGNOSIS — F418 Other specified anxiety disorders: Secondary | ICD-10-CM | POA: Diagnosis present

## 2015-05-09 HISTORY — DX: Noninfective gastroenteritis and colitis, unspecified: K52.9

## 2015-05-09 HISTORY — DX: Anxiety disorder, unspecified: F41.9

## 2015-05-09 HISTORY — DX: Major depressive disorder, single episode, unspecified: F32.9

## 2015-05-09 HISTORY — DX: Depression, unspecified: F32.A

## 2015-05-09 LAB — URINALYSIS, ROUTINE W REFLEX MICROSCOPIC
Glucose, UA: NEGATIVE mg/dL
HGB URINE DIPSTICK: NEGATIVE
Ketones, ur: NEGATIVE mg/dL
Leukocytes, UA: NEGATIVE
Nitrite: NEGATIVE
Protein, ur: NEGATIVE mg/dL
SPECIFIC GRAVITY, URINE: 1.008 (ref 1.005–1.030)
pH: 7 (ref 5.0–8.0)

## 2015-05-09 LAB — COMPREHENSIVE METABOLIC PANEL
ALT: 177 U/L — AB (ref 17–63)
ANION GAP: 15 (ref 5–15)
AST: 126 U/L — ABNORMAL HIGH (ref 15–41)
Albumin: 3.4 g/dL — ABNORMAL LOW (ref 3.5–5.0)
Alkaline Phosphatase: 151 U/L — ABNORMAL HIGH (ref 38–126)
BUN: <5 mg/dL — ABNORMAL LOW (ref 6–20)
CHLORIDE: 102 mmol/L (ref 101–111)
CO2: 21 mmol/L — AB (ref 22–32)
CREATININE: 0.8 mg/dL (ref 0.61–1.24)
Calcium: 9.2 mg/dL (ref 8.9–10.3)
GFR calc Af Amer: >60 mL/min (ref >60)
GFR calc non Af Amer: >60 mL/min (ref >60)
Glucose, Bld: 71 mg/dL (ref 65–99)
Potassium: 5.7 mmol/L — ABNORMAL HIGH (ref 3.5–5.1)
Sodium: 138 mmol/L (ref 135–145)
Total Bilirubin: 8.6 mg/dL — ABNORMAL HIGH (ref 0.3–1.2)
Total Protein: 8.4 g/dL — ABNORMAL HIGH (ref 6.5–8.1)

## 2015-05-09 LAB — CBC
HEMATOCRIT: 39.8 % (ref 39.0–52.0)
HEMOGLOBIN: 13.3 g/dL (ref 13.0–17.0)
MCH: 28.4 pg (ref 26.0–34.0)
MCHC: 33.4 g/dL (ref 30.0–36.0)
MCV: 84.9 fL (ref 78.0–100.0)
Platelets: 227 10*3/uL (ref 150–400)
RBC: 4.69 MIL/uL (ref 4.22–5.81)
RDW: 13.3 % (ref 11.5–15.5)
WBC: 7.1 10*3/uL (ref 4.0–10.5)

## 2015-05-09 LAB — LIPASE, BLOOD: Lipase: 71 U/L — ABNORMAL HIGH (ref 11–51)

## 2015-05-09 LAB — LACTIC ACID, PLASMA: LACTIC ACID, VENOUS: 1 mmol/L (ref 0.5–2.0)

## 2015-05-09 LAB — BILIRUBIN, DIRECT: BILIRUBIN DIRECT: 4 mg/dL — AB (ref 0.1–0.5)

## 2015-05-09 LAB — ACETAMINOPHEN LEVEL: Acetaminophen (Tylenol), Serum: <10 ug/mL — ABNORMAL LOW (ref 10–30)

## 2015-05-09 MED ORDER — IOHEXOL 300 MG/ML  SOLN
100.0000 mL | Freq: Once | INTRAMUSCULAR | Status: AC | PRN
  Administered 2015-05-09: 100 mL via INTRAVENOUS

## 2015-05-09 MED ORDER — SODIUM CHLORIDE 0.9 % IV SOLN
INTRAVENOUS | Status: DC
  Administered 2015-05-09: via INTRAVENOUS

## 2015-05-09 MED ORDER — HYDROXYZINE HCL 25 MG PO TABS
25.0000 mg | ORAL_TABLET | Freq: Two times a day (BID) | ORAL | Status: DC
  Administered 2015-05-09 – 2015-05-14 (×11): 25 mg via ORAL
  Filled 2015-05-09 (×12): qty 1

## 2015-05-09 MED ORDER — ONDANSETRON HCL 4 MG/2ML IJ SOLN: 4.0000 mg | Freq: Four times a day (QID) | INTRAMUSCULAR | Status: DC | PRN

## 2015-05-09 MED ORDER — ONDANSETRON HCL 4 MG PO TABS: 4.0000 mg | ORAL_TABLET | Freq: Four times a day (QID) | ORAL | Status: DC | PRN

## 2015-05-09 MED ORDER — VENLAFAXINE HCL 75 MG PO TABS
75.0000 mg | ORAL_TABLET | Freq: Three times a day (TID) | ORAL | Status: DC
  Administered 2015-05-10 – 2015-05-15 (×15): 75 mg via ORAL
  Filled 2015-05-09 (×19): qty 1

## 2015-05-09 MED ORDER — SODIUM CHLORIDE 0.9 % IV BOLUS (SEPSIS)
1000.0000 mL | Freq: Once | INTRAVENOUS | Status: AC
  Administered 2015-05-09: 1000 mL via INTRAVENOUS

## 2015-05-09 MED ORDER — ONDANSETRON HCL 4 MG/2ML IJ SOLN
4.0000 mg | Freq: Once | INTRAMUSCULAR | Status: AC
  Administered 2015-05-09: 4 mg via INTRAVENOUS
  Filled 2015-05-09: qty 2

## 2015-05-09 NOTE — ED Notes (Signed)
Patient transported to CT 

## 2015-05-09 NOTE — ED Notes (Signed)
Returned from CT.

## 2015-05-09 NOTE — H&P (Signed)
[facility] History and Physical  Mike Greer [id number] DOB: 19-Feb-1981 DOA: 05/09/2015  Referring physician: Yvone Neu. PCP: No primary care provider on file.  Specialists: None.  Chief Complaint: Jaundice.  HPI: Mike Greer is a 35 y.o. male with history of colitis presents to the ER because of jaundice. Patient is presently incarcerated. Patient found that he was getting increasingly jaundiced with yellow colored urine. This was noticed over last 3 days. Patient states he does drink alcohol every day but has not had any alcohol since October 2016. Patient's direct and indirect bilirubin almost at the same level. With mildly elevated globulin level. In addition patient has chronic right lower quadrant abdominal pain and patient states he had colonoscopy 2 years ago which showed colitis. Patient has not followed up with gastroenterologist for any medications. On exam patient does have right lower quadrant tenderness. Denies any nausea vomiting or diarrhea but states that he gets off and on diarrhea with some blood in it every month. Presently afebrile.   Review of Systems: As presented in the history of presenting illness, rest negative.  Past Medical History  Diagnosis Date  . Asthma   . Colitis   . Anxiety   . Depression    History reviewed. No pertinent past surgical history. Social History:  reports that he has been smoking Cigarettes.  He has been smoking about 2.00 packs per day. He has never used smokeless tobacco. He reports that he drinks about 1.2 oz of alcohol per week. He reports that he uses illicit drugs (Marijuana). Where does patient live Home. Presently incarcerated. Can patient participate in ADLs? Yes.  No Known Allergies  Family History:  Family History  Problem Relation Age of Onset  . Heart attack Father       Prior to Admission medications   Medication Sig Start Date End Date Taking? Authorizing Provider  hydrOXYzine (ATARAX/VISTARIL) 25 MG  tablet Take 25 mg by mouth 2 (two) times daily.   Yes Historical Provider, MD  venlafaxine (EFFEXOR) 75 MG tablet Take 75 mg by mouth 3 (three) times daily.   Yes Historical Provider, MD  ibuprofen (ADVIL,MOTRIN) 200 MG tablet Take 400-600 mg by mouth every 6 (six) hours as needed for headache, mild pain or moderate pain.     Historical Provider, MD    Physical Exam: Filed Vitals:   05/09/15 2015 05/09/15 2030 05/09/15 2045 05/09/15 2216  BP: 128/78 130/74 128/76 138/70  Pulse: 76 78 81   Temp:    98.8 F (37.1 C)  TempSrc:    Oral  Resp: 18 16 16 19   Weight:    186 lb 15.2 oz (84.8 kg)  SpO2: 100% 100% 100% 99%     General:  Moderately built and nourished.  Eyes: Anicteric. No pallor.  ENT: No discharge from the ears eyes nose and mouth.  Neck: No mass felt. No neck rigidity.  Cardiovascular: S1 and S2 heard.  Respiratory: No rhonchi or crepitations.  Abdomen: Soft mild tenderness in the right lower quadrant.  Skin: No rash.  Musculoskeletal: No edema.  Psychiatric: Appears normal.  Neurologic: Alert awake oriented to time place and person. Moves all extremities.  Labs on Admission:  Basic Metabolic Panel:  Recent Labs Lab 05/09/15 1617  NA 138  K 5.7*  CL 102  CO2 21*  GLUCOSE 71  BUN <5*  CREATININE 0.80  CALCIUM 9.2   Liver Function Tests:  Recent Labs Lab 05/09/15 1617  AST 126*  ALT 177*  ALKPHOS  151*  BILITOT 8.6*  PROT 8.4*  ALBUMIN 3.4*    Recent Labs Lab 05/09/15 1617  LIPASE 71*   No results for input(s): AMMONIA in the last 168 hours. CBC:  Recent Labs Lab 05/09/15 1617  WBC 7.1  HGB 13.3  HCT 39.8  MCV 84.9  PLT 227   Cardiac Enzymes: No results for input(s): CKTOTAL, CKMB, CKMBINDEX, TROPONINI in the last 168 hours.  BNP (last 3 results) No results for input(s): BNP in the last 8760 hours.  ProBNP (last 3 results) No results for input(s): PROBNP in the last 8760 hours.  CBG: No results for input(s): GLUCAP  in the last 168 hours.  Radiological Exams on Admission: Ct Abdomen Pelvis W Contrast  05/09/2015  CLINICAL DATA:  Right lower quadrant pain, jaundice, elevated liver function tests. EXAM: CT ABDOMEN AND PELVIS WITH CONTRAST TECHNIQUE: Multidetector CT imaging of the abdomen and pelvis was performed using the standard protocol following bolus administration of intravenous contrast. CONTRAST:  136mL OMNIPAQUE IOHEXOL 300 MG/ML  SOLN COMPARISON:  None. FINDINGS: Lower chest:  No acute findings. Hepatobiliary: Liver appears normal. Gallbladder appears normal. No bile duct dilatation. Pancreas: No mass, inflammatory changes, or other significant abnormality. Spleen: Within normal limits in size and appearance. Adrenals/Urinary Tract: No masses identified. No evidence of hydronephrosis. Stomach/Bowel: Walls of the distal small bowel (terminal ileum) and cecum are markedly/irregularly thickened with associated inflammation and fluid stranding in the surrounding mesentery. No evidence of associated bowel obstruction at this time. Moderate amount of stool throughout the normal-caliber colon. There is thickening at the base of the appendix suggesting involvement. Vascular/Lymphatic: No pathologically enlarged lymph nodes. Clustered small lymph nodes in the right lower quadrant mesentery are likely reactive in nature. No evidence of abdominal aortic aneurysm. Reproductive: No mass or other significant abnormality. Other: No abscess collection seen.  No free intraperitoneal air. Musculoskeletal:  No acute or suspicious osseous lesion. IMPRESSION: 1. Marked irregular thickening of the walls of the distal small bowel (terminal ileum) and cecum, consistent with a fairly severe enterocolitis of infectious or inflammatory nature, highly suspicious by location for Crohn's disease. No associated bowel obstruction. No abscess collection seen. No evidence of bowel perforation seen. Thickening of the walls of the proximal appendix  suggests involvement, but distal appendix appears normal in caliber with no evidence of appendicitis. 2. Remainder of the abdomen and pelvis is unremarkable. Liver appears normal. Gallbladder appears normal. No bile duct dilatation. Electronically Signed   By: Franki Cabot M.D.   On: 05/09/2015 20:10     Assessment/Plan Principal Problem:   Jaundice Active Problems:   Hyperbilirubinemia   Colitis   1. Jaundice - direct and indirect bilirubin levels almost identical. Since patient's low blood levels are elevated then abdomen we will check a knee and anti-smooth muscle antibodies for autoimmune hepatitis. Check antimitochondrial antibodies and ceruloplasmin levels. Follow acute hepatitis panel. Follow LFTs and INR. Consult gastroenterologist in a.m. Tylenol levels were undetectable. Check HIV. 2. Colitis - patient states he has had colitis for almost 2 years now. [facility]. I have no access to the records. Consult gastroenterologist for further recommendations. CAT scan does show cecal inflammation but no appendicitis. For now I have placed patient on Cipro and Flagyl and full liquid diet.   DVT Prophylaxis SCDs.  Code Status: Full code.  Family Communication: Discussed with patient.  Disposition Plan: Admit for observation.    Kaynan Klonowski N. [facility] Pager [phone].  If 7PM-7AM,  please contact night-coverage [email or web address] Password [facility] 05/09/2015, 11:17 PM

## 2015-05-09 NOTE — ED Notes (Signed)
He c/o 4 day history of yellow eyes and dark urine. He reports some abd pain, poor appetite and generally not feeling well. He denies past history of liver disease.

## 2015-05-10 DIAGNOSIS — K529 Noninfective gastroenteritis and colitis, unspecified: Secondary | ICD-10-CM

## 2015-05-10 DIAGNOSIS — R17 Unspecified jaundice: Secondary | ICD-10-CM

## 2015-05-10 LAB — HEPATIC FUNCTION PANEL
ALBUMIN: 3 g/dL — AB (ref 3.5–5.0)
ALK PHOS: 154 U/L — AB (ref 38–126)
ALT: 153 U/L — AB (ref 17–63)
AST: 95 U/L — AB (ref 15–41)
Bilirubin, Direct: 4.7 mg/dL — ABNORMAL HIGH (ref 0.1–0.5)
Indirect Bilirubin: 3.3 mg/dL — ABNORMAL HIGH (ref 0.3–0.9)
Total Bilirubin: 8 mg/dL — ABNORMAL HIGH (ref 0.3–1.2)
Total Protein: 7.6 g/dL (ref 6.5–8.1)

## 2015-05-10 LAB — BASIC METABOLIC PANEL
Anion gap: 9 (ref 5–15)
CALCIUM: 8.9 mg/dL (ref 8.9–10.3)
CHLORIDE: 104 mmol/L (ref 101–111)
CO2: 26 mmol/L (ref 22–32)
CREATININE: 0.86 mg/dL (ref 0.61–1.24)
GFR calc Af Amer: >60 mL/min (ref >60)
GLUCOSE: 89 mg/dL (ref 65–99)
POTASSIUM: 3.3 mmol/L — AB (ref 3.5–5.1)
Sodium: 139 mmol/L (ref 135–145)

## 2015-05-10 LAB — HEPATITIS PANEL, ACUTE
HCV Ab: <0.1 s/co ratio (ref 0.0–0.9)
HEP B C IGM: NEGATIVE
HEP B S AG: NEGATIVE
Hep A IgM: NEGATIVE

## 2015-05-10 LAB — PROTIME-INR
INR: 1.02 (ref 0.00–1.49)
Prothrombin Time: 13.6 seconds (ref 11.6–15.2)

## 2015-05-10 LAB — CBC
HCT: 37.5 % — ABNORMAL LOW (ref 39.0–52.0)
HEMOGLOBIN: 12.7 g/dL — AB (ref 13.0–17.0)
MCH: 29 pg (ref 26.0–34.0)
MCHC: 33.9 g/dL (ref 30.0–36.0)
MCV: 85.6 fL (ref 78.0–100.0)
Platelets: 276 10*3/uL (ref 150–400)
RBC: 4.38 MIL/uL (ref 4.22–5.81)
RDW: 13.4 % (ref 11.5–15.5)
WBC: 8.6 10*3/uL (ref 4.0–10.5)

## 2015-05-10 LAB — HIV ANTIBODY (ROUTINE TESTING W REFLEX): HIV Screen 4th Generation wRfx: NONREACTIVE

## 2015-05-10 MED ORDER — CIPROFLOXACIN IN D5W 400 MG/200ML IV SOLN
400.0000 mg | Freq: Two times a day (BID) | INTRAVENOUS | Status: DC
  Administered 2015-05-10 – 2015-05-13 (×8): 400 mg via INTRAVENOUS
  Filled 2015-05-10 (×8): qty 200

## 2015-05-10 MED ORDER — METRONIDAZOLE IN NACL 5-0.79 MG/ML-% IV SOLN
500.0000 mg | Freq: Three times a day (TID) | INTRAVENOUS | Status: DC
  Administered 2015-05-10 – 2015-05-14 (×12): 500 mg via INTRAVENOUS
  Filled 2015-05-10 (×12): qty 100

## 2015-05-10 MED ORDER — DIPHENHYDRAMINE HCL 50 MG/ML IJ SOLN
25.0000 mg | Freq: Once | INTRAMUSCULAR | Status: AC
  Administered 2015-05-10: 25 mg via INTRAVENOUS
  Filled 2015-05-10: qty 1

## 2015-05-10 MED ORDER — POTASSIUM CHLORIDE CRYS ER 20 MEQ PO TBCR
40.0000 meq | EXTENDED_RELEASE_TABLET | Freq: Once | ORAL | Status: AC
  Administered 2015-05-10: 40 meq via ORAL
  Filled 2015-05-10: qty 2

## 2015-05-10 NOTE — ED Provider Notes (Signed)
CSN: [id number]     Arrival date & time 05/09/15  1557 History   First MD Initiated Contact with Patient 05/09/15 1631     Chief Complaint  Patient presents with  . Jaundice      HPI Patient presents to the emergency department with complaints of yellowing of his eyes in the past 4-5 days.  He is seen by the medical staff at [facility] prison [facility] he [facility] and he was sent to the emergency department for further evaluation.  He states he was started on Remeron 2 weeks ago.  He has never had hepatitis before.  He denies current alcohol use.  He's been incarcerated since October.  He reports heavy alcohol use prior to that.  He states he has mild nausea without vomiting.  He denies abdominal pain.  No fevers or chills.  No recent illness.  Denies chest pain shortness of breath.   Past Medical History  Diagnosis Date  . Asthma   . Colitis   . Anxiety   . Depression    History reviewed. No pertinent past surgical history. Family History  Problem Relation Age of Onset  . Heart attack Father    Social History  Substance Use Topics  . Smoking status: Current Every Day Smoker -- 2.00 packs/day    Types: Cigarettes  . Smokeless tobacco: Never Used  . 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     Allergies  Review of patient's allergies indicates no known allergies.  Home Medications   Prior to Admission medications   Medication Sig Start Date End Date Taking? Authorizing Provider  hydrOXYzine (ATARAX/VISTARIL) 25 MG tablet Take 25 mg by mouth 2 (two) times daily.   Yes Historical Provider, MD  venlafaxine (EFFEXOR) 75 MG tablet Take 75 mg by mouth 3 (three) times daily.   Yes Historical Provider, MD  ibuprofen (ADVIL,MOTRIN) 200 MG tablet Take 400-600 mg by mouth every 6 (six) hours as needed for headache, mild pain or moderate pain.     Historical  Provider, MD   BP 128/70 mmHg  Pulse 75  Temp(Src) 97.9 F (36.6 C) (Oral)  Resp 18  Ht 5\' 10"  (1.778 m)  Wt 186 lb 15.2 oz (84.8 kg)  BMI 26.82 kg/m2  SpO2 100% Physical Exam  Constitutional: He is oriented to person, place, and time. He appears well-developed and well-nourished.  HENT:  Head: Normocephalic and atraumatic.  Eyes: EOM are normal.  Scleral icterus  Neck: Normal range of motion.  Cardiovascular: Normal rate, regular rhythm, normal heart sounds and intact distal pulses.   Pulmonary/Chest: Effort normal and breath sounds normal. No respiratory distress.  Abdominal: Soft. He exhibits no distension. There is no tenderness.  Musculoskeletal: Normal range of motion.  Neurological: He is alert and oriented to person, place, and time.  Skin: Skin is warm and dry.  Jaundice  Psychiatric: He has a normal mood and affect. Judgment normal.  Nursing note and vitals reviewed.   ED Course  Procedures (including critical care time) Labs Review Labs Reviewed  COMPREHENSIVE METABOLIC PANEL - Abnormal; Notable for the following:    Potassium 5.7 (*)    CO2 21 (*)    BUN <5 (*)    Total Protein 8.4 (*)    Albumin 3.4 (*)    AST 126 (*)  ALT 177 (*)    Alkaline Phosphatase 151 (*)    Total Bilirubin 8.6 (*)    All other components within normal limits  URINALYSIS, ROUTINE W REFLEX MICROSCOPIC (NOT AT [facility]) - Abnormal; Notable for the following:    Color, Urine AMBER (*)    Bilirubin Urine LARGE (*)    All other components within normal limits  LIPASE, BLOOD - Abnormal; Notable for the following:    Lipase 71 (*)    All other components within normal limits  ACETAMINOPHEN LEVEL - Abnormal; Notable for the following:    Acetaminophen (Tylenol), Serum <10 (*)    All other components within normal limits  BILIRUBIN, DIRECT - Abnormal; Notable for the following:    Bilirubin, Direct 4.0 (*)    All other components within normal limits  BASIC METABOLIC PANEL - Abnormal;  Notable for the following:    Potassium 3.3 (*)    BUN <5 (*)    All other components within normal limits  CBC - Abnormal; Notable for the following:    Hemoglobin 12.7 (*)    HCT 37.5 (*)    All other components within normal limits  HEPATIC FUNCTION PANEL - Abnormal; Notable for the following:    Albumin 3.0 (*)    AST 95 (*)    ALT 153 (*)    Alkaline Phosphatase 154 (*)    Total Bilirubin 8.0 (*)    Bilirubin, Direct 4.7 (*)    Indirect Bilirubin 3.3 (*)    All other components within normal limits  CBC  HEPATITIS PANEL, ACUTE  LACTIC ACID, PLASMA  PROTIME-INR  HIV ANTIBODY (ROUTINE TESTING)  ANA, BODY FLUID  ANTI-SMOOTH MUSCLE ANTIBODY, IGG  MITOCHONDRIAL ANTIBODIES  CERULOPLASMIN  CBC  COMPREHENSIVE METABOLIC PANEL    Imaging Review Ct Abdomen Pelvis W Contrast  05/09/2015  CLINICAL DATA:  Right lower quadrant pain, jaundice, elevated liver function tests. EXAM: CT ABDOMEN AND PELVIS WITH CONTRAST TECHNIQUE: Multidetector CT imaging of the abdomen and pelvis was performed using the standard protocol following bolus administration of intravenous contrast. CONTRAST:  160mL OMNIPAQUE IOHEXOL 300 MG/ML  SOLN COMPARISON:  None. FINDINGS: Lower chest:  No acute findings. Hepatobiliary: Liver appears normal. Gallbladder appears normal. No bile duct dilatation. Pancreas: No mass, inflammatory changes, or other significant abnormality. Spleen: Within normal limits in size and appearance. Adrenals/Urinary Tract: No masses identified. No evidence of hydronephrosis. Stomach/Bowel: Walls of the distal small bowel (terminal ileum) and cecum are markedly/irregularly thickened with associated inflammation and fluid stranding in the surrounding mesentery. No evidence of associated bowel obstruction at this time. Moderate amount of stool throughout the normal-caliber colon. There is thickening at the base of the appendix suggesting involvement. Vascular/Lymphatic: No pathologically enlarged  lymph nodes. Clustered small lymph nodes in the right lower quadrant mesentery are likely reactive in nature. No evidence of abdominal aortic aneurysm. Reproductive: No mass or other significant abnormality. Other: No abscess collection seen.  No free intraperitoneal air. Musculoskeletal:  No acute or suspicious osseous lesion. IMPRESSION: 1. Marked irregular thickening of the walls of the distal small bowel (terminal ileum) and cecum, consistent with a fairly severe enterocolitis of infectious or inflammatory nature, highly suspicious by location for Crohn's disease. No associated bowel obstruction. No abscess collection seen. No evidence of bowel perforation seen. Thickening of the walls of the proximal appendix suggests involvement, but distal appendix appears normal in caliber with no evidence of appendicitis. 2. Remainder of the abdomen and pelvis is unremarkable. Liver appears normal. Gallbladder  appears normal. No bile duct dilatation. Electronically Signed   By: Franki Cabot M.D.   On: 05/09/2015 20:10   I have personally reviewed and evaluated these images and lab results as part of my medical decision-making.   EKG Interpretation None      MDM   Final diagnoses:  Hyperbilirubinemia  RLQ abdominal pain  Elevated liver function tests    Acute hepatitis panel sent.  Tylenol level sent.  Tylenol levels normal.  Acute hepatitis panel pending.  .  Patient be admitted the hospital for additional workup.  Patient likely needs GI consultation as well.  CT scan of his abdomen and pelvis was completed because he was having mild right lower quadrant pain.  There is concern for possible terminal ileitis.    Jola Schmidt, MD 05/10/15 [phone]

## 2015-05-10 NOTE — Progress Notes (Signed)
Patient ID: Mike Greer, male   DOB: 1980/05/28, 35 y.o.   MRN: [id number]  [facility] PROGRESS NOTE  Mike Greer [id number] DOB: [birth year] DOA: 05/09/2015 PCP: No primary care provider on file.   Brief narrative:    35 y.o. male presented to [facility] ED for evaluation of jaundice several days in duration, associated with right lower quadrant abd pain that has been intermittent and occasionally but not consistently radiating to the epigastric area, worse with certain types of food but pt can not exactly recall which food makes it worse. He reports having colonoscopy done two years ago when he was in jail but was told he had some polyps that were removed (Dr. Raliegh Ip wrote in his admission note that colitis was noted at that time).   In ED, pt noted to be hemodynamically stable, VSS, blood work notable for mild transaminitis with bili 8. CT abd and pelvis worrisome for ? Inflammatory bowel disease, ? Crohn's. [facility] asked to admit for further evaluation.   Assessment/Plan:    Principal Problem:   Jaundice - transaminitis with elevated bili - hepatitis panel pending  - GI team consulted, assistance is appreciated   Active Problems:   ? IBD - will d/w GI if colonoscopy needed - keep on clear liquid for now     Hypokalemia - mild, supplement and repeat BMP in AM  DVT prophylaxis - SCD's   Code Status: Full.  Family Communication:  plan of care discussed with the patient Disposition Plan: Home by 3/19  IV access:  Peripheral IV  Procedures and diagnostic studies:    Ct Abdomen Pelvis W Contrast 05/09/2015  Marked irregular thickening of the walls of the distal small bowel (terminal ileum) and cecum, consistent with a fairly severe enterocolitis of infectious or inflammatory nature, highly suspicious by location for Crohn's disease. No associated bowel obstruction. No abscess collection seen. No evidence of bowel perforation seen. Thickening of the walls of the proximal appendix  suggests involvement, but distal appendix appears normal in caliber with no evidence of appendicitis. 2. Remainder of the abdomen and pelvis is unremarkable. Liver appears normal. Gallbladder appears normal. No bile duct dilatation.   Medical Consultants:  GI  Other Consultants:  None  IAnti-Infectives:   Cipro 3/16 --> Flagyl 3/16 -->  [name], MD  [facility] Pager [phone]  If 7PM-7AM, please contact night-coverage [email or web address] Password [email or web address] 05/10/2015, 11:08 AM  HPI/Subjective: No events overnight.   Objective: Filed Vitals:   05/09/15 2216 05/10/15 0517 05/10/15 0838 05/10/15 0900  BP: 138/70 106/70  115/66  Pulse:  57  73  Temp: 98.8 F (37.1 C) 98.6 F (37 C)  98.4 F (36.9 C)  TempSrc: Oral Oral  Oral  Resp: 19 19  16   Height:   5\' 10"  (1.778 m)   Weight: 84.8 kg (186 lb 15.2 oz)     SpO2: 99% 100%  100%    Intake/Output Summary (Last 24 hours) at 05/10/15 1108 Last data filed at 05/09/15 2349  Gross per 24 hour  Intake    120 ml  Output    600 ml  Net   -480 ml   Exam:   General:  Pt is alert, follows commands appropriately, not in acute distress, jaundiced skin   Cardiovascular: Regular rate and rhythm, S1/S2, no murmurs, no rubs, no gallops  Respiratory: Clear to auscultation bilaterally, no wheezing, no crackles, no rhonchi  Abdomen: Soft, non distended, bowel sounds present, no guarding  Extremities: pulses DP and  PT palpable bilaterally  Neuro: Grossly nonfocal  Data Reviewed: Basic Metabolic Panel:  Recent Labs Lab 05/09/15 1617 05/10/15 0714  NA 138 139  K 5.7* 3.3*  CL 102 104  CO2 21* 26  GLUCOSE 71 89  BUN <5* <5*  CREATININE 0.80 0.86  CALCIUM 9.2 8.9   Liver Function Tests:  Recent Labs Lab 05/09/15 1617 05/10/15 0714  AST 126* 95*  ALT 177* 153*  ALKPHOS 151* 154*  BILITOT 8.6* 8.0*  PROT 8.4* 7.6  ALBUMIN 3.4* 3.0*    Recent Labs Lab 05/09/15 1617  LIPASE 71*   CBC:  Recent Labs Lab  05/09/15 1617 05/10/15 0714  WBC 7.1 8.6  HGB 13.3 12.7*  HCT 39.8 37.5*  MCV 84.9 85.6  PLT 227 276   Scheduled Meds: . ciprofloxacin  400 mg Intravenous Q12H  . hydrOXYzine  25 mg Oral BID  . metronidazole  500 mg Intravenous Q8H  . venlafaxine  75 mg Oral TID WC   Continuous Infusions: . sodium chloride 75 mL/hr at 05/09/15 2349

## 2015-05-10 NOTE — Progress Notes (Signed)
Pharmacy Antibiotic Note  Mike Greer is a 35 y.o. male admitted on 05/09/2015 with Intra-abdominal infection.  Pharmacy has been consulted for Cipro dosing.  Plan: -Cipro 400 mg IV q12h -Flagyl per MD -F/U infectious work-up   Weight: 186 lb 15.2 oz (84.8 kg)  Temp (24hrs), Avg:98.7 F (37.1 C), Min:98.1 F (36.7 C), Max:99.1 F (37.3 C)   Recent Labs Lab 05/09/15 1617 05/09/15 2132  WBC 7.1  --   CREATININE 0.80  --   LATICACIDVEN  --  1.0    CrCl cannot be calculated (Unknown ideal weight.).    No Known Allergies   Mike Greer 05/10/2015 7:09 AM

## 2015-05-10 NOTE — Consult Note (Addendum)
[facility] Consult Note  Referring Provider: No ref. provider found Primary Care Physician:  No primary care provider on file. Primary Gastroenterologist:  Dr.  Laurel Dimmer Complaint: Jaundice HPI: Mike Greer is an 35 y.o. black male  prison inmate who developed chills malaise and noted scleral icterus as well as dark urine 5 days ago. He reported to the nurse who obtained a urinalysis and apparently eventually some blood work which resulted in resulted in him being referred here for admission. He also has some mild chronic diffuse nonspecific abdominal pain complaints as well as intermittent constipation and irregularity. He rarely sees blood in his stool. He reportedly had a colonoscopy 2 years ago in prison which showed some degree of "colitis ". He was never treated for it. He had a CT on admission here which showed marked thickening of the distal small bowel and cecum consistent with fairly severe enterocolitis of inflammatory nature highly suspicious for Crohn's disease with no associated obstruction or abscess. His liver gallbladder and bile ducts looked completely normal. Acute hepatitis panel and other serologic workup for identifiable causes of liver disease, HIV are ordered and pending.  Past Medical History  Diagnosis Date  . Asthma   . Colitis   . Anxiety   . Depression     History reviewed. No pertinent past surgical history.  Medications Prior to Admission  Medication Sig Dispense Refill  . hydrOXYzine (ATARAX/VISTARIL) 25 MG tablet Take 25 mg by mouth 2 (two) times daily.    Marland Kitchen venlafaxine (EFFEXOR) 75 MG tablet Take 75 mg by mouth 3 (three) times daily.    Marland Kitchen ibuprofen (ADVIL,MOTRIN) 200 MG tablet Take 400-600 mg by mouth every 6 (six) hours as needed for headache, mild pain or moderate pain.       Allergies: No Known Allergies  Family History  Problem Relation Age of Onset  . Heart attack Father     Social History:  reports that he has been smoking  Cigarettes.  He has been smoking about 2.00 packs per day. He has never used smokeless tobacco. He reports that he drinks about 1.2 oz of alcohol per week. He reports that he uses illicit drugs (Marijuana).  Review of Systems: negative except As above    Blood pressure 115/66, pulse 73, temperature 98.4 F (36.9 C), temperature source Oral, resp. rate 16, height _0  (1.778 m), weight 84.8 kg (186 lb 15.2 oz), SpO2 100 %. Head: Normocephalic, without obvious abnormality, atraumatic Neck: no adenopathy, no carotid bruit, no JVD, supple, symmetrical, trachea midline and thyroid not enlarged, symmetric, no tenderness/mass/nodules Resp: clear to auscultation bilaterally Cardio: regular rate and rhythm, S1, S2 normal, no murmur, click, rub or gallop GI: abdomen soft nondistended with moderate tenderness mainly in the right lower quadrant Extremities: extremities normal, atraumatic, no cyanosis or edema  Results for orders placed or performed during the hospital encounter of 05/09/15 (from the past 48 hour(s))  Comprehensive metabolic panel     Status: Abnormal   Collection Time: 05/09/15  4:17 PM  Result Value Ref Range   Sodium 138 135 - 145 mmol/L   Potassium 5.7 (H) 3.5 - 5.1 mmol/L   Chloride 102 101 - 111 mmol/L   CO2 21 (L) 22 - 32 mmol/L   Glucose, Bld 71 65 - 99 mg/dL   BUN <5 (L) 6 - 20 mg/dL   Creatinine, Ser 0.80 0.61 - 1.24 mg/dL   Calcium 9.2 8.9 - 10.3 mg/dL   Total Protein 8.4 (H) 6.5 -  8.1 g/dL   Albumin 3.4 (L) 3.5 - 5.0 g/dL   AST 126 (H) 15 - 41 U/L   ALT 177 (H) 17 - 63 U/L   Alkaline Phosphatase 151 (H) 38 - 126 U/L   Total Bilirubin 8.6 (H) 0.3 - 1.2 mg/dL    Comment: ICTERIC SPECIMEN   GFR calc non Af Amer >60 >60 mL/min   GFR calc Af Amer >60 >60 mL/min    Comment: (NOTE) The eGFR has been calculated using the CKD EPI equation. This calculation has not been validated in all clinical situations. eGFR's persistently <60 mL/min signify possible Chronic  Kidney Disease.    Anion gap 15 5 - 15  CBC     Status: None   Collection Time: 05/09/15  4:17 PM  Result Value Ref Range   WBC 7.1 4.0 - 10.5 K/uL   RBC 4.69 4.22 - 5.81 MIL/uL   Hemoglobin 13.3 13.0 - 17.0 g/dL   HCT 39.8 39.0 - 52.0 %   MCV 84.9 78.0 - 100.0 fL   MCH 28.4 26.0 - 34.0 pg   MCHC 33.4 30.0 - 36.0 g/dL   RDW 13.3 11.5 - 15.5 %   Platelets 227 150 - 400 K/uL  Lipase, blood     Status: Abnormal   Collection Time: 05/09/15  4:17 PM  Result Value Ref Range   Lipase 71 (H) 11 - 51 U/L  Urinalysis, Routine w reflex microscopic (not at [facility])     Status: Abnormal   Collection Time: 05/09/15  5:47 PM  Result Value Ref Range   Color, Urine AMBER (A) YELLOW    Comment: BIOCHEMICALS MAY BE AFFECTED BY COLOR   APPearance CLEAR CLEAR   Specific Gravity, Urine 1.008 1.005 - 1.030   pH 7.0 5.0 - 8.0   Glucose, UA NEGATIVE NEGATIVE mg/dL   Hgb urine dipstick NEGATIVE NEGATIVE   Bilirubin Urine LARGE (A) NEGATIVE   Ketones, ur NEGATIVE NEGATIVE mg/dL   Protein, ur NEGATIVE NEGATIVE mg/dL   Nitrite NEGATIVE NEGATIVE   Leukocytes, UA NEGATIVE NEGATIVE    Comment: MICROSCOPIC NOT DONE ON URINES WITH NEGATIVE PROTEIN, BLOOD, LEUKOCYTES, NITRITE, OR GLUCOSE <1000 mg/dL.  Acetaminophen level     Status: Abnormal   Collection Time: 05/09/15  7:16 PM  Result Value Ref Range   Acetaminophen (Tylenol), Serum <10 (L) 10 - 30 ug/mL    Comment:        THERAPEUTIC CONCENTRATIONS VARY SIGNIFICANTLY. A RANGE OF 10-30 ug/mL MAY BE AN EFFECTIVE CONCENTRATION FOR MANY PATIENTS. HOWEVER, SOME ARE BEST TREATED AT CONCENTRATIONS OUTSIDE THIS RANGE. ACETAMINOPHEN CONCENTRATIONS >150 ug/mL AT 4 HOURS AFTER INGESTION AND >50 ug/mL AT 12 HOURS AFTER INGESTION ARE OFTEN ASSOCIATED WITH TOXIC REACTIONS.   Bilirubin, direct     Status: Abnormal   Collection Time: 05/09/15  9:32 PM  Result Value Ref Range   Bilirubin, Direct 4.0 (H) 0.1 - 0.5 mg/dL  Lactic acid, plasma     Status: None    Collection Time: 05/09/15  9:32 PM  Result Value Ref Range   Lactic Acid, Venous 1.0 0.5 - 2.0 mmol/L  Basic metabolic panel     Status: Abnormal   Collection Time: 05/10/15  7:14 AM  Result Value Ref Range   Sodium 139 135 - 145 mmol/L   Potassium 3.3 (L) 3.5 - 5.1 mmol/L   Chloride 104 101 - 111 mmol/L   CO2 26 22 - 32 mmol/L   Glucose, Bld 89 65 - 99 mg/dL  BUN <5 (L) 6 - 20 mg/dL   Creatinine, Ser 0.86 0.61 - 1.24 mg/dL   Calcium 8.9 8.9 - 10.3 mg/dL   GFR calc non Af Amer >60 >60 mL/min   GFR calc Af Amer >60 >60 mL/min    Comment: (NOTE) The eGFR has been calculated using the CKD EPI equation. This calculation has not been validated in all clinical situations. eGFR's persistently <60 mL/min signify possible Chronic Kidney Disease.    Anion gap 9 5 - 15  CBC     Status: Abnormal   Collection Time: 05/10/15  7:14 AM  Result Value Ref Range   WBC 8.6 4.0 - 10.5 K/uL   RBC 4.38 4.22 - 5.81 MIL/uL   Hemoglobin 12.7 (L) 13.0 - 17.0 g/dL   HCT 37.5 (L) 39.0 - 52.0 %   MCV 85.6 78.0 - 100.0 fL   MCH 29.0 26.0 - 34.0 pg   MCHC 33.9 30.0 - 36.0 g/dL   RDW 13.4 11.5 - 15.5 %   Platelets 276 150 - 400 K/uL  Hepatic function panel     Status: Abnormal   Collection Time: 05/10/15  7:14 AM  Result Value Ref Range   Total Protein 7.6 6.5 - 8.1 g/dL   Albumin 3.0 (L) 3.5 - 5.0 g/dL   AST 95 (H) 15 - 41 U/L   ALT 153 (H) 17 - 63 U/L   Alkaline Phosphatase 154 (H) 38 - 126 U/L   Total Bilirubin 8.0 (H) 0.3 - 1.2 mg/dL   Bilirubin, Direct 4.7 (H) 0.1 - 0.5 mg/dL   Indirect Bilirubin 3.3 (H) 0.3 - 0.9 mg/dL  Protime-INR     Status: None   Collection Time: 05/10/15  7:14 AM  Result Value Ref Range   Prothrombin Time 13.6 11.6 - 15.2 seconds   INR 1.02 0.00 - 1.49   Ct Abdomen Pelvis W Contrast  05/09/2015  CLINICAL DATA:  Right lower quadrant pain, jaundice, elevated liver function tests. EXAM: CT ABDOMEN AND PELVIS WITH CONTRAST TECHNIQUE: Multidetector CT imaging of the  abdomen and pelvis was performed using the standard protocol following bolus administration of intravenous contrast. CONTRAST:  173m OMNIPAQUE IOHEXOL 300 MG/ML  SOLN COMPARISON:  None. FINDINGS: Lower chest:  No acute findings. Hepatobiliary: Liver appears normal. Gallbladder appears normal. No bile duct dilatation. Pancreas: No mass, inflammatory changes, or other significant abnormality. Spleen: Within normal limits in size and appearance. Adrenals/Urinary Tract: No masses identified. No evidence of hydronephrosis. Stomach/Bowel: Walls of the distal small bowel (terminal ileum) and cecum are markedly/irregularly thickened with associated inflammation and fluid stranding in the surrounding mesentery. No evidence of associated bowel obstruction at this time. Moderate amount of stool throughout the normal-caliber colon. There is thickening at the base of the appendix suggesting involvement. Vascular/Lymphatic: No pathologically enlarged lymph nodes. Clustered small lymph nodes in the right lower quadrant mesentery are likely reactive in nature. No evidence of abdominal aortic aneurysm. Reproductive: No mass or other significant abnormality. Other: No abscess collection seen.  No free intraperitoneal air. Musculoskeletal:  No acute or suspicious osseous lesion. IMPRESSION: 1. Marked irregular thickening of the walls of the distal small bowel (terminal ileum) and cecum, consistent with a fairly severe enterocolitis of infectious or inflammatory nature, highly suspicious by location for Crohn's disease. No associated bowel obstruction. No abscess collection seen. No evidence of bowel perforation seen. Thickening of the walls of the proximal appendix suggests involvement, but distal appendix appears normal in caliber with no evidence of appendicitis. 2.  Remainder of the abdomen and pelvis is unremarkable. Liver appears normal. Gallbladder appears normal. No bile duct dilatation. Electronically Signed   By: Franki Cabot M.D.   On: 05/09/2015 20:10    Assessment: Elevated liver function tests with cholestatic picture etiology unclear, clinical presentation suggests an acute hepatitis like picture Terminal ileitis suspected Crohn's disease Plan:  Would wait for serologic workup for identifiable sources of liver disease before considering starting anything empirically for Crohn's disease like corticosteroids. Will consider budesonide or possibly consider repeat colonoscopy. We'll follow with you. Raeanna Soberanes C 05/10/2015, 10:27 AM  Pager [phone] If no answer or after 5 PM call [phone]

## 2015-05-11 LAB — COMPREHENSIVE METABOLIC PANEL
ALT: 143 U/L — AB (ref 17–63)
AST: 95 U/L — AB (ref 15–41)
Albumin: 2.7 g/dL — ABNORMAL LOW (ref 3.5–5.0)
Alkaline Phosphatase: 144 U/L — ABNORMAL HIGH (ref 38–126)
Anion gap: 8 (ref 5–15)
CHLORIDE: 105 mmol/L (ref 101–111)
CO2: 24 mmol/L (ref 22–32)
CREATININE: 0.69 mg/dL (ref 0.61–1.24)
Calcium: 8.7 mg/dL — ABNORMAL LOW (ref 8.9–10.3)
Glucose, Bld: 94 mg/dL (ref 65–99)
POTASSIUM: 3.6 mmol/L (ref 3.5–5.1)
SODIUM: 137 mmol/L (ref 135–145)
Total Bilirubin: 4.8 mg/dL — ABNORMAL HIGH (ref 0.3–1.2)
Total Protein: 6.7 g/dL (ref 6.5–8.1)

## 2015-05-11 LAB — IRON AND TIBC
IRON: 139 ug/dL (ref 45–182)
Saturation Ratios: 40 % — ABNORMAL HIGH (ref 17.9–39.5)
TIBC: 351 ug/dL (ref 250–450)
UIBC: 212 ug/dL

## 2015-05-11 LAB — CBC
HCT: 33.8 % — ABNORMAL LOW (ref 39.0–52.0)
Hemoglobin: 11.5 g/dL — ABNORMAL LOW (ref 13.0–17.0)
MCH: 29 pg (ref 26.0–34.0)
MCHC: 34 g/dL (ref 30.0–36.0)
MCV: 85.4 fL (ref 78.0–100.0)
PLATELETS: 267 10*3/uL (ref 150–400)
RBC: 3.96 MIL/uL — AB (ref 4.22–5.81)
RDW: 13.6 % (ref 11.5–15.5)
WBC: 6.4 10*3/uL (ref 4.0–10.5)

## 2015-05-11 LAB — CERULOPLASMIN: Ceruloplasmin: 44.8 mg/dL — ABNORMAL HIGH (ref 16.0–31.0)

## 2015-05-11 LAB — FERRITIN: Ferritin: 271 ng/mL (ref 24–336)

## 2015-05-11 NOTE — Progress Notes (Signed)
Patient ID: Mike Greer, male   DOB: 09/10/1980, 35 y.o.   MRN: [id number]  [facility] PROGRESS NOTE  Mike Greer [id number] DOB: [birth year] DOA: 05/09/2015 PCP: No primary care provider on file.   Brief narrative:    35 y.o. male presented to [facility] ED for evaluation of jaundice several days in duration, associated with right lower quadrant abd pain that has been intermittent and occasionally but not consistently radiating to the epigastric area, worse with certain types of food but pt can not exactly recall which food makes it worse. He reports having colonoscopy done two years ago when he was in jail but was told he had some polyps that were removed (Dr. Raliegh Ip wrote in his admission note that colitis was noted at that time).   In ED, pt noted to be hemodynamically stable, VSS, blood work notable for mild transaminitis with bili 8. CT abd and pelvis worrisome for ? Inflammatory bowel disease, ? Crohn's. [facility] asked to admit for further evaluation.   Assessment/Plan:    Principal Problem:   Jaundice - transaminitis with elevated bili - bili trending down  - hepatitis panel so far negative  - GI team consulted, assistance is appreciated, will continue to follow up on recommendations   Active Problems:   ? IBD - pt wants diet advanced - ? Elevated ceruloplasmin level    Hypokalemia - supplemented and WNL  DVT prophylaxis - SCD's   Code Status: Full.  Family Communication:  plan of care discussed with the patient Disposition Plan: Home by 3/19  IV access:  Peripheral IV  Procedures and diagnostic studies:    Ct Abdomen Pelvis W Contrast 05/09/2015  Marked irregular thickening of the walls of the distal small bowel (terminal ileum) and cecum, consistent with a fairly severe enterocolitis of infectious or inflammatory nature, highly suspicious by location for Crohn's disease. No associated bowel obstruction. No abscess collection seen. No evidence of bowel perforation seen.  Thickening of the walls of the proximal appendix suggests involvement, but distal appendix appears normal in caliber with no evidence of appendicitis. 2. Remainder of the abdomen and pelvis is unremarkable. Liver appears normal. Gallbladder appears normal. No bile duct dilatation.   Medical Consultants:  GI  Other Consultants:  None  IAnti-Infectives:   Cipro 3/16 --> Flagyl 3/16 -->  [name], MD  [facility] Pager [phone]  If 7PM-7AM, please contact night-coverage [email or web address] Password [facility] 05/11/2015, 12:53 PM  HPI/Subjective: No events overnight.   Objective: Filed Vitals:   05/10/15 1800 05/10/15 2041 05/11/15 0354 05/11/15 0930  BP: 128/70 114/61 119/64 130/72  Pulse: 75 71 64 73  Temp: 97.9 F (36.6 C) 98.3 F (36.8 C) 98.2 F (36.8 C) 98.3 F (36.8 C)  TempSrc: Oral Oral Oral Oral  Resp: 18 19 20 19   Height:      Weight:  85.1 kg (187 lb 9.8 oz)    SpO2: 100% 99% 99% 99%    Intake/Output Summary (Last 24 hours) at 05/11/15 1253 Last data filed at 05/11/15 1244  Gross per 24 hour  Intake   2260 ml  Output      0 ml  Net   2260 ml   Exam:   General:  Pt is alert, follows commands appropriately, not in acute distress, less jaundiced   Cardiovascular: Regular rate and rhythm, S1/S2, no murmurs, no rubs, no gallops  Respiratory: Clear to auscultation bilaterally, no wheezing, no crackles, no rhonchi  Abdomen: Soft, non distended, bowel sounds present, no  guarding  Extremities: pulses DP and PT palpable bilaterally  Neuro: Grossly nonfocal  Data Reviewed: Basic Metabolic Panel:  Recent Labs Lab 05/09/15 1617 05/10/15 0714 05/11/15 0715  NA 138 139 137  K 5.7* 3.3* 3.6  CL 102 104 105  CO2 21* 26 24  GLUCOSE 71 89 94  BUN <5* <5* <5*  CREATININE 0.80 0.86 0.69  CALCIUM 9.2 8.9 8.7*   Liver Function Tests:  Recent Labs Lab 05/09/15 1617 05/10/15 0714 05/11/15 0715  AST 126* 95* 95*  ALT 177* 153* 143*  ALKPHOS 151* 154* 144*   BILITOT 8.6* 8.0* 4.8*  PROT 8.4* 7.6 6.7  ALBUMIN 3.4* 3.0* 2.7*    Recent Labs Lab 05/09/15 1617  LIPASE 71*   CBC:  Recent Labs Lab 05/09/15 1617 05/10/15 0714 05/11/15 0715  WBC 7.1 8.6 6.4  HGB 13.3 12.7* 11.5*  HCT 39.8 37.5* 33.8*  MCV 84.9 85.6 85.4  PLT 227 276 267   Scheduled Meds: . ciprofloxacin  400 mg Intravenous Q12H  . hydrOXYzine  25 mg Oral BID  . metronidazole  500 mg Intravenous Q8H  . venlafaxine  75 mg Oral TID WC   Continuous Infusions:

## 2015-05-11 NOTE — Progress Notes (Signed)
Subjective: RLQ pain improving. Tolerating regular diet.  Objective: Vital signs in last 24 hours: Temp:  [97.9 F (36.6 C)-98.3 F (36.8 C)] 98.3 F (36.8 C) (03/18 0930) Pulse Rate:  [64-75] 73 (03/18 0930) Resp:  [18-20] 19 (03/18 0930) BP: (114-130)/(61-72) 130/72 mmHg (03/18 0930) SpO2:  [99 %-100 %] 99 % (03/18 0930) Weight:  [85.1 kg (187 lb 9.8 oz)] 85.1 kg (187 lb 9.8 oz) (03/17 2041) Weight change: 0.3 kg (10.6 oz) Last BM Date: 05/10/15  PE: GEN:  NAD HEENT:  Scleral icterus ABD:  Mild right lower quadrant tenderness without peritonitis, active bowel sounds  Lab Results:  CBC    Component Value Date/Time   WBC 6.4 05/11/2015 0715   RBC 3.96* 05/11/2015 0715   HGB 11.5* 05/11/2015 0715   HCT 33.8* 05/11/2015 0715   PLT 267 05/11/2015 0715   MCV 85.4 05/11/2015 0715   MCH 29.0 05/11/2015 0715   MCHC 34.0 05/11/2015 0715   RDW 13.6 05/11/2015 0715   LYMPHSABS 1.5 04/27/2014 0042   MONOABS 0.6 04/27/2014 0042   EOSABS 0.2 04/27/2014 0042   BASOSABS 0.0 04/27/2014 0042   CMP     Component Value Date/Time   NA 137 05/11/2015 0715   K 3.6 05/11/2015 0715   CL 105 05/11/2015 0715   CO2 24 05/11/2015 0715   GLUCOSE 94 05/11/2015 0715   BUN <5* 05/11/2015 0715   CREATININE 0.69 05/11/2015 0715   CALCIUM 8.7* 05/11/2015 0715   PROT 6.7 05/11/2015 0715   ALBUMIN 2.7* 05/11/2015 0715   AST 95* 05/11/2015 0715   ALT 143* 05/11/2015 0715   ALKPHOS 144* 05/11/2015 0715   BILITOT 4.8* 05/11/2015 0715   GFRNONAA >60 05/11/2015 0715   GFRAA >60 05/11/2015 0715   Assessment:  1.  Cholestatic jaundice.  LFTs improving.  Serologies pending.  Acute Hepatitis A/B/C studies negative.  Suspect infectious process. 2.  Right lower quadrant abdominal pain.  Enterocolitis, specifically Crohn's disease, leading consideration.  Clinical history and imaging not consistent with appendicitis. 3.  Abnormal CT abdomen/pelvis, ileocolic thickening, suspicious for Crohn's  disease.  Plan:  1.  Further liver labs:  Iron profile, ferritin, EBV IgM/IgG, CMV IgM/IgG, immunoglobulins. 2.  Awaiting:  ANA, ASMA, AMA. 3.  Clear liquid diet and bowel prep tomorrow. 4.  Colonoscopy tentatively planned for Monday, will need propofol. 5.  [facility] will follow.   Landry Dyke 05/11/2015, 2:15 PM   Pager [phone] If no answer or after 5 PM call [phone]

## 2015-05-12 LAB — COMPREHENSIVE METABOLIC PANEL
ALK PHOS: 167 U/L — AB (ref 38–126)
ALT: 183 U/L — ABNORMAL HIGH (ref 17–63)
ANION GAP: 11 (ref 5–15)
AST: 123 U/L — ABNORMAL HIGH (ref 15–41)
Albumin: 2.7 g/dL — ABNORMAL LOW (ref 3.5–5.0)
BUN: 6 mg/dL (ref 6–20)
CALCIUM: 9.1 mg/dL (ref 8.9–10.3)
CHLORIDE: 105 mmol/L (ref 101–111)
CO2: 25 mmol/L (ref 22–32)
Creatinine, Ser: 0.66 mg/dL (ref 0.61–1.24)
GFR calc non Af Amer: >60 mL/min (ref >60)
Glucose, Bld: 100 mg/dL — ABNORMAL HIGH (ref 65–99)
Potassium: 3.7 mmol/L (ref 3.5–5.1)
SODIUM: 141 mmol/L (ref 135–145)
Total Bilirubin: 3.1 mg/dL — ABNORMAL HIGH (ref 0.3–1.2)
Total Protein: 7 g/dL (ref 6.5–8.1)

## 2015-05-12 LAB — CBC
HCT: 35.1 % — ABNORMAL LOW (ref 39.0–52.0)
Hemoglobin: 12 g/dL — ABNORMAL LOW (ref 13.0–17.0)
MCH: 29.3 pg (ref 26.0–34.0)
MCHC: 34.2 g/dL (ref 30.0–36.0)
MCV: 85.8 fL (ref 78.0–100.0)
PLATELETS: 332 10*3/uL (ref 150–400)
RBC: 4.09 MIL/uL — ABNORMAL LOW (ref 4.22–5.81)
RDW: 13.7 % (ref 11.5–15.5)
WBC: 8.2 10*3/uL (ref 4.0–10.5)

## 2015-05-12 LAB — IGG, IGA, IGM
IGG (IMMUNOGLOBIN G), SERUM: 1409 mg/dL (ref 700–1600)
IgA: 439 mg/dL — ABNORMAL HIGH (ref 90–386)
IgM, Serum: 115 mg/dL (ref 20–172)

## 2015-05-12 LAB — CMV IGM: CMV IgM: <30 AU/mL (ref 0.0–29.9)

## 2015-05-12 LAB — CMV ANTIBODY, IGG (EIA): CMV Ab - IgG: 6.7 U/mL — ABNORMAL HIGH (ref 0.00–0.59)

## 2015-05-12 LAB — EBV AB TO VIRAL CAPSID AG PNL, IGG+IGM
EBV VCA IGG: 225 U/mL — AB (ref 0.0–17.9)
EBV VCA IgM: <36 U/mL (ref 0.0–35.9)

## 2015-05-12 LAB — ANTI-SMOOTH MUSCLE ANTIBODY, IGG: F-Actin IgG: 12 Units (ref 0–19)

## 2015-05-12 LAB — MITOCHONDRIAL ANTIBODIES: MITOCHONDRIAL M2 AB, IGG: 12.6 U (ref 0.0–20.0)

## 2015-05-12 MED ORDER — PEG-KCL-NACL-NASULF-NA ASC-C 100 G PO SOLR: 1.0000 | Freq: Once | ORAL | Status: DC

## 2015-05-12 MED ORDER — PEG-KCL-NACL-NASULF-NA ASC-C 100 G PO SOLR
0.5000 | Freq: Once | ORAL | Status: AC
  Administered 2015-05-12: 100 g via ORAL
  Filled 2015-05-12: qty 1

## 2015-05-12 MED ORDER — BISACODYL 5 MG PO TBEC
10.0000 mg | DELAYED_RELEASE_TABLET | Freq: Once | ORAL | Status: AC
  Administered 2015-05-12: 10 mg via ORAL
  Filled 2015-05-12: qty 2

## 2015-05-12 NOTE — Progress Notes (Addendum)
Patient ID: Mike Greer, male   DOB: [birth year], 35 y.o.   MRN: [id number]  [facility] PROGRESS NOTE  DESTRY DAUBER [id number] DOB: 1980/02/27 DOA: 05/09/2015 PCP: No primary care provider on file.   Brief narrative:    35 y.o. male presented to [facility] ED for evaluation of jaundice several days in duration, associated with right lower quadrant abd pain that has been intermittent and occasionally but not consistently radiating to the epigastric area, worse with certain types of food but pt can not exactly recall which food makes it worse. He reports having colonoscopy done two years ago when he was in jail but was told he had some polyps that were removed (Dr. Raliegh Ip wrote in his admission note that colitis was noted at that time).   In ED, pt noted to be hemodynamically stable, VSS, blood work notable for mild transaminitis with bili 8. CT abd and pelvis worrisome for ? Inflammatory bowel disease, ? Crohn's. [facility] asked to admit for further evaluation.   Assessment/Plan:    Principal Problem:   Jaundice - transaminitis with elevated bili - hepatitis panel so far negative  - EBV, CMV, autoimmune serologies pending  - appreciate GI team assistance, plan for colonoscopy in AM  Active Problems:   ? IBD - ? Elevated ceruloplasmin level - plan for colonoscopy in AM    Hypokalemia - supplemented and WNL  DVT prophylaxis - SCD's   Code Status: Full.  Family Communication:  plan of care discussed with the patient Disposition Plan: Home by 3/21  IV access:  Peripheral IV  Procedures and diagnostic studies:    Ct Abdomen Pelvis W Contrast 05/09/2015  Marked irregular thickening of the walls of the distal small bowel (terminal ileum) and cecum, consistent with a fairly severe enterocolitis of infectious or inflammatory nature, highly suspicious by location for Crohn's disease. No associated bowel obstruction. No abscess collection seen. No evidence of bowel perforation seen. Thickening of  the walls of the proximal appendix suggests involvement, but distal appendix appears normal in caliber with no evidence of appendicitis. 2. Remainder of the abdomen and pelvis is unremarkable. Liver appears normal. Gallbladder appears normal. No bile duct dilatation.   Medical Consultants:  GI  Other Consultants:  None  IAnti-Infectives:   Cipro 3/16 --> Flagyl 3/16 -->  [name], MD  [facility] Pager [phone]  If 7PM-7AM, please contact night-coverage [email or web address] Password [facility] 05/12/2015, 11:33 AM  HPI/Subjective: No events overnight.   Objective: Filed Vitals:   05/11/15 1615 05/11/15 1953 05/12/15 0633 05/12/15 0937  BP: 116/58 117/62 137/68 95/52  Pulse: 76 73 63 66  Temp: 98.7 F (37.1 C) 97.7 F (36.5 C) 97.5 F (36.4 C) 97.9 F (36.6 C)  TempSrc: Oral Oral Oral Oral  Resp: _0 Height:      Weight:  85.2 kg (187 lb 13.3 oz)    SpO2: 99% 99% 99% 100%    Intake/Output Summary (Last 24 hours) at 05/12/15 1133 Last data filed at 05/12/15 8657  Gross per 24 hour  Intake   1580 ml  Output    300 ml  Net   1280 ml   Exam:   General:  Pt is alert, follows commands appropriately, not in acute distress, less jaundiced   Cardiovascular: Regular rate and rhythm, S1/S2, no murmurs, no rubs, no gallops  Respiratory: Clear to auscultation bilaterally, no wheezing, no crackles, no rhonchi  Abdomen: Soft, non distended, bowel sounds present, no guarding  Extremities: pulses  DP and PT palpable bilaterally  Neuro: Grossly nonfocal  Data Reviewed: Basic Metabolic Panel:  Recent Labs Lab 05/09/15 1617 05/10/15 0714 05/11/15 0715 05/12/15 0534  NA 138 139 137 141  K 5.7* 3.3* 3.6 3.7  CL 102 104 105 105  CO2 21* _0 GLUCOSE 71 89 94 100*  BUN <5* <5* <5* 6  CREATININE 0.80 0.86 0.69 0.66  CALCIUM 9.2 8.9 8.7* 9.1   Liver Function Tests:  Recent Labs Lab 05/09/15 1617 05/10/15 0714 05/11/15 0715 05/12/15 0534  AST 126* 95*  95* 123*  ALT 177* 153* 143* 183*  ALKPHOS 151* 154* 144* 167*  BILITOT 8.6* 8.0* 4.8* 3.1*  PROT 8.4* 7.6 6.7 7.0  ALBUMIN 3.4* 3.0* 2.7* 2.7*    Recent Labs Lab 05/09/15 1617  LIPASE 71*   CBC:  Recent Labs Lab 05/09/15 1617 05/10/15 0714 05/11/15 0715 05/12/15 0534  WBC 7.1 8.6 6.4 8.2  HGB 13.3 12.7* 11.5* 12.0*  HCT 39.8 37.5* 33.8* 35.1*  MCV 84.9 85.6 85.4 85.8  PLT 227 276 267 332   Scheduled Meds: . bisacodyl  10 mg Oral Once  . ciprofloxacin  400 mg Intravenous Q12H  . hydrOXYzine  25 mg Oral BID  . metronidazole  500 mg Intravenous Q8H  . peg 3350 powder  1 kit Oral Once  . venlafaxine  75 mg Oral TID WC   Continuous Infusions:

## 2015-05-12 NOTE — Progress Notes (Signed)
Subjective: Slowly improving abdominal pain.  Objective: Vital signs in last 24 hours: Temp:  [97.5 F (36.4 C)-98.7 F (37.1 C)] 97.9 F (36.6 C) (03/19 0937) Pulse Rate:  [63-76] 66 (03/19 0937) Resp:  [18-20] 19 (03/19 0937) BP: (95-137)/(52-68) 95/52 mmHg (03/19 0937) SpO2:  [99 %-100 %] 100 % (03/19 0937) Weight:  [85.2 kg (187 lb 13.3 oz)] 85.2 kg (187 lb 13.3 oz) (03/18 1953) Weight change: 0.1 kg (3.5 oz) Last BM Date: 05/10/15  PE: GEN:  NAD, less scleral icterus ABD:  Soft, mild right-sided tenderness, no peritonitis  Lab Results: CBC    Component Value Date/Time   WBC 8.2 05/12/2015 0534   RBC 4.09* 05/12/2015 0534   HGB 12.0* 05/12/2015 0534   HCT 35.1* 05/12/2015 0534   PLT 332 05/12/2015 0534   MCV 85.8 05/12/2015 0534   MCH 29.3 05/12/2015 0534   MCHC 34.2 05/12/2015 0534   RDW 13.7 05/12/2015 0534   LYMPHSABS 1.5 04/27/2014 0042   MONOABS 0.6 04/27/2014 0042   EOSABS 0.2 04/27/2014 0042   BASOSABS 0.0 04/27/2014 0042   CMP     Component Value Date/Time   NA 141 05/12/2015 0534   K 3.7 05/12/2015 0534   CL 105 05/12/2015 0534   CO2 25 05/12/2015 0534   GLUCOSE 100* 05/12/2015 0534   BUN 6 05/12/2015 0534   CREATININE 0.66 05/12/2015 0534   CALCIUM 9.1 05/12/2015 0534   PROT 7.0 05/12/2015 0534   ALBUMIN 2.7* 05/12/2015 0534   AST 123* 05/12/2015 0534   ALT 183* 05/12/2015 0534   ALKPHOS 167* 05/12/2015 0534   BILITOT 3.1* 05/12/2015 0534   GFRNONAA >60 05/12/2015 0534   GFRAA >60 05/12/2015 0534   Assessment:  1. Cholestatic jaundice.Slight increase AST/ALT, bilirubin decreasing. Serologies pending. Acute Hepatitis A/B/C studies negative. Suspect infectious process. 2. Right lower quadrant abdominal pain. Enterocolitis, specifically Crohn's disease, leading consideration. Clinical history and imaging not consistent with appendicitis. 3. Abnormal CT abdomen/pelvis, ileocolic thickening, suspicious for Crohn's  disease.  Plan:  1.  Awaiting CMV, EBV, autoimmune serologies, immunoglobulins. 2.  Colonoscopy tomorrow. 3.  [facility] will follow.   Landry Dyke 05/12/2015, 11:22 AM   Pager [phone] If no answer or after 5 PM call [phone]

## 2015-05-13 ENCOUNTER — Inpatient Hospital Stay (HOSPITAL_COMMUNITY): Payer: Self-pay | Admitting: Anesthesiology

## 2015-05-13 ENCOUNTER — Encounter (HOSPITAL_COMMUNITY)
Admission: EM | Disposition: A | Discharge status: Home / Self Care | Payer: Self-pay | Source: Home / Self Care | Attending: Internal Medicine

## 2015-05-13 ENCOUNTER — Encounter (HOSPITAL_COMMUNITY): Payer: Self-pay | Admitting: *Deleted

## 2015-05-13 HISTORY — PX: COLONOSCOPY: SHX5424

## 2015-05-13 LAB — COMPREHENSIVE METABOLIC PANEL
ALBUMIN: 2.9 g/dL — AB (ref 3.5–5.0)
ALT: 218 U/L — ABNORMAL HIGH (ref 17–63)
ANION GAP: 14 (ref 5–15)
AST: 153 U/L — AB (ref 15–41)
Alkaline Phosphatase: 173 U/L — ABNORMAL HIGH (ref 38–126)
CHLORIDE: 105 mmol/L (ref 101–111)
CO2: 22 mmol/L (ref 22–32)
Calcium: 9 mg/dL (ref 8.9–10.3)
Creatinine, Ser: 0.78 mg/dL (ref 0.61–1.24)
GFR calc Af Amer: >60 mL/min (ref >60)
GFR calc non Af Amer: >60 mL/min (ref >60)
GLUCOSE: 91 mg/dL (ref 65–99)
POTASSIUM: 3.7 mmol/L (ref 3.5–5.1)
SODIUM: 141 mmol/L (ref 135–145)
Total Bilirubin: 2.6 mg/dL — ABNORMAL HIGH (ref 0.3–1.2)
Total Protein: 7.3 g/dL (ref 6.5–8.1)

## 2015-05-13 LAB — CBC
HEMATOCRIT: 33.8 % — AB (ref 39.0–52.0)
Hemoglobin: 11.4 g/dL — ABNORMAL LOW (ref 13.0–17.0)
MCH: 28.8 pg (ref 26.0–34.0)
MCHC: 33.7 g/dL (ref 30.0–36.0)
MCV: 85.4 fL (ref 78.0–100.0)
PLATELETS: 380 10*3/uL (ref 150–400)
RBC: 3.96 MIL/uL — ABNORMAL LOW (ref 4.22–5.81)
RDW: 13.7 % (ref 11.5–15.5)
WBC: 9.3 10*3/uL (ref 4.0–10.5)

## 2015-05-13 LAB — ANTINUCLEAR ANTIBODIES, IFA: ANTINUCLEAR ANTIBODIES, IFA: NEGATIVE

## 2015-05-13 SURGERY — COLONOSCOPY
Anesthesia: Monitor Anesthesia Care

## 2015-05-13 MED ORDER — PROPOFOL 500 MG/50ML IV EMUL
INTRAVENOUS | Status: DC | PRN
  Administered 2015-05-13: 175 ug/kg/min via INTRAVENOUS

## 2015-05-13 MED ORDER — SODIUM CHLORIDE 0.9 % IV SOLN: INTRAVENOUS | Status: DC

## 2015-05-13 MED ORDER — PROPOFOL 10 MG/ML IV BOLUS
INTRAVENOUS | Status: DC | PRN
  Administered 2015-05-13: 20 mg via INTRAVENOUS

## 2015-05-13 MED ORDER — MIDAZOLAM HCL 2 MG/2ML IJ SOLN
INTRAMUSCULAR | Status: DC | PRN
  Administered 2015-05-13: 2 mg via INTRAVENOUS

## 2015-05-13 MED ORDER — LACTATED RINGERS IV SOLN
INTRAVENOUS | Status: DC
  Administered 2015-05-13: 1000 mL via INTRAVENOUS

## 2015-05-13 MED ORDER — MIDAZOLAM HCL 2 MG/2ML IJ SOLN
INTRAMUSCULAR | Status: AC
  Filled 2015-05-13: qty 2

## 2015-05-13 NOTE — Anesthesia Postprocedure Evaluation (Signed)
Anesthesia Post Note  Patient: Mike Greer  Procedure(s) Performed: Procedure(s) (LRB): COLONOSCOPY (N/A)  Patient location during evaluation: PACU Anesthesia Type: MAC Level of consciousness: awake and alert Pain management: pain level controlled Vital Signs Assessment: post-procedure vital signs reviewed and stable Respiratory status: spontaneous breathing, nonlabored ventilation, respiratory function stable and patient connected to nasal cannula oxygen Cardiovascular status: stable and blood pressure returned to baseline Anesthetic complications: no    Last Vitals:  Filed Vitals:   05/13/15 0957 05/13/15 1000  BP: 102/53 102/53  Pulse: 69 59  Temp: 36.4 C   Resp: 15 16    Last Pain:  Filed Vitals:   05/13/15 1000  PainSc: 0-No pain                 [name],JAMES TERRILL

## 2015-05-13 NOTE — Interval H&P Note (Signed)
History and Physical Interval Note:  05/13/2015 9:05 AM  Mike Greer  has presented today for surgery, with the diagnosis of Abdominal Pain, Crohns  The various methods of treatment have been discussed with the patient and family. After consideration of risks, benefits and other options for treatment, the patient has consented to  Procedure(s): COLONOSCOPY (N/A) as a surgical intervention .  The patient's history has been reviewed, patient examined, no change in status, stable for surgery.  I have reviewed the patient's chart and labs.  Questions were answered to the patient's satisfaction.     Mike Greer M  Assessment:  1.  RLQ pain.  No appendicitis on CT.  Ileocolic thickening seen on seen.  History of "colitis."  Plan:  1.  Colonoscopy. 2.  Risks (bleeding, infection, bowel perforation that could require surgery, sedation-related changes in cardiopulmonary systems), benefits (identification and possible treatment of source of symptoms, exclusion of certain causes of symptoms), and alternatives (watchful waiting, radiographic imaging studies, empiric medical treatment) of colonoscopy were explained to patient/family in detail and patient wishes to proceed.

## 2015-05-13 NOTE — Anesthesia Preprocedure Evaluation (Addendum)
Anesthesia Evaluation  Patient identified by MRN, date of birth, ID band Patient awake    Reviewed: Allergy & Precautions, NPO status   Airway Mallampati: I  TM Distance: >3 FB Neck ROM: Full    Dental  (+) Teeth Intact   Pulmonary asthma , Current Smoker,    breath sounds clear to auscultation       Cardiovascular  Rhythm:Regular Rate:Normal     Neuro/Psych    GI/Hepatic (+)     substance abuse  alcohol use, Hepatitis -Likely hepatitis c jaundice   Endo/Other  negative endocrine ROS  Renal/GU negative Renal ROS     Musculoskeletal   Abdominal   Peds  Hematology negative hematology ROS (+)   Anesthesia Other Findings   Reproductive/Obstetrics                            Anesthesia Physical Anesthesia Plan  ASA: II  Anesthesia Plan: MAC   Post-op Pain Management:    Induction: Intravenous  Airway Management Planned: Natural Airway  Additional Equipment:   Intra-op Plan:   Post-operative Plan:   Informed Consent: I have reviewed the patients History and Physical, chart, labs and discussed the procedure including the risks, benefits and alternatives for the proposed anesthesia with the patient or authorized representative who has indicated his/her understanding and acceptance.   Dental advisory given  Plan Discussed with:   Anesthesia Plan Comments:        Anesthesia Quick Evaluation

## 2015-05-13 NOTE — Progress Notes (Addendum)
Patient ID: Mike Greer, male   DOB: December 01, 1980, 35 y.o.   MRN: [id number]  [facility] PROGRESS NOTE  Mike Greer [id number] DOB: [birth year] DOA: 05/09/2015 PCP: No primary care provider on file.   Brief narrative:    35 y.o. male presented to [facility] ED for evaluation of jaundice several days in duration, associated with right lower quadrant abd pain that has been intermittent and occasionally but not consistently radiating to the epigastric area, worse with certain types of food but pt can not exactly recall which food makes it worse. He reports having colonoscopy done two years ago when he was in jail but was told he had some polyps that were removed (Dr. Raliegh Greer wrote in his admission note that colitis was noted at that time).   In ED, pt noted to be hemodynamically stable, VSS, blood work notable for mild transaminitis with bili 8. CT abd and pelvis worrisome for ? Inflammatory bowel disease, ? Crohn's. [facility] asked to admit for further evaluation.   Assessment/Plan:    Principal Problem:   Jaundice - transaminitis with elevated bili - hepatitis panel negative  - EBV, CMV, autoimmune serologies pending  - appreciate GI team assistance, plan for colonoscopy this AM  Active Problems:   ? IBD - ? Elevated ceruloplasmin level - follow up on colonoscopy results     Hypokalemia - supplemented and WNL  DVT prophylaxis - SCD's   Code Status: Full.  Family Communication:  plan of care discussed with the patient Disposition Plan: Home by 3/21 if GI team clears   IV access:  Peripheral IV  Procedures and diagnostic studies:    Ct Abdomen Pelvis W Contrast 05/09/2015  Marked irregular thickening of the walls of the distal small bowel (terminal ileum) and cecum, consistent with a fairly severe enterocolitis of infectious or inflammatory nature, highly suspicious by location for Crohn's disease. No associated bowel obstruction. No abscess collection seen. No evidence of bowel  perforation seen. Thickening of the walls of the proximal appendix suggests involvement, but distal appendix appears normal in caliber with no evidence of appendicitis. 2. Remainder of the abdomen and pelvis is unremarkable. Liver appears normal. Gallbladder appears normal. No bile duct dilatation.   Medical Consultants:  GI  Other Consultants:  None  IAnti-Infectives:   Cipro 3/16 --> Flagyl 3/16 -->  Mike Ramsay, MD  [facility] Pager [phone]  If 7PM-7AM, please contact night-coverage [email or web address] Password [facility] 05/13/2015, 10:42 AM  HPI/Subjective: No events overnight.   Objective: Filed Vitals:   05/13/15 0816 05/13/15 0957 05/13/15 1000 05/13/15 1010  BP: 120/64 102/53 102/53 111/72  Pulse:  69 59 58  Temp: 98.2 F (36.8 C) 97.6 F (36.4 C)    TempSrc: Oral Oral    Resp: 12 15 16 18   Height:      Weight:      SpO2: 99% 93% 100% 100%    Intake/Output Summary (Last 24 hours) at 05/13/15 1042 Last data filed at 05/13/15 0942  Gross per 24 hour  Intake   3360 ml  Output      5 ml  Net   3355 ml   Exam:   General:  Pt is alert, follows commands appropriately, not in acute distress, less jaundiced   Cardiovascular: Regular rate and rhythm, S1/S2, no murmurs, no rubs, no gallops  Respiratory: Clear to auscultation bilaterally, no wheezing, no crackles, no rhonchi  Abdomen: Soft, non distended, bowel sounds present, no guarding  Extremities: pulses DP and PT palpable bilaterally  Neuro: Grossly nonfocal  Data Reviewed: Basic Metabolic Panel:  Recent Labs Lab 05/09/15 1617 05/10/15 0714 05/11/15 0715 05/12/15 0534 05/13/15 0620  NA 138 139 137 141 141  K 5.7* 3.3* 3.6 3.7 3.7  CL 102 104 105 105 105  CO2 21* 26 24 25 22   GLUCOSE 71 89 94 100* 91  BUN <5* <5* <5* 6 <5*  CREATININE 0.80 0.86 0.69 0.66 0.78  CALCIUM 9.2 8.9 8.7* 9.1 9.0   Liver Function Tests:  Recent Labs Lab 05/09/15 1617 05/10/15 0714 05/11/15 0715 05/12/15 0534  05/13/15 0620  AST 126* 95* 95* 123* 153*  ALT 177* 153* 143* 183* 218*  ALKPHOS 151* 154* 144* 167* 173*  BILITOT 8.6* 8.0* 4.8* 3.1* 2.6*  PROT 8.4* 7.6 6.7 7.0 7.3  ALBUMIN 3.4* 3.0* 2.7* 2.7* 2.9*    Recent Labs Lab 05/09/15 1617  LIPASE 71*   CBC:  Recent Labs Lab 05/09/15 1617 05/10/15 0714 05/11/15 0715 05/12/15 0534 05/13/15 0620  WBC 7.1 8.6 6.4 8.2 9.3  HGB 13.3 12.7* 11.5* 12.0* 11.4*  HCT 39.8 37.5* 33.8* 35.1* 33.8*  MCV 84.9 85.6 85.4 85.8 85.4  PLT 227 276 267 332 380   Scheduled Meds: . [MAR Hold] ciprofloxacin  400 mg Intravenous Q12H  . [MAR Hold] hydrOXYzine  25 mg Oral BID  . [MAR Hold] metronidazole  500 mg Intravenous Q8H  . [MAR Hold] venlafaxine  75 mg Oral TID WC   Continuous Infusions:

## 2015-05-13 NOTE — H&P (View-Only) (Signed)
Patient ID: Mike Greer, male   DOB: 12/28/1980, 34 y.o.   MRN: [id number]  [facility] PROGRESS NOTE  Mike Greer MRN:[id number] DOB: 06/06/1980 DOA: 05/09/2015 PCP: No primary care provider on file.   Brief narrative:    34 y.o. male presented to [facility] ED for evaluation of jaundice several days in duration, associated with right lower quadrant abd pain that has been intermittent and occasionally but not consistently radiating to the epigastric area, worse with certain types of food but pt can not exactly recall which food makes it worse. He reports having colonoscopy done two years ago when he was in jail but was told he had some polyps that were removed (Dr. K wrote in his admission note that colitis was noted at that time).   In ED, pt noted to be hemodynamically stable, VSS, blood work notable for mild transaminitis with bili 8. CT abd and pelvis worrisome for ? Inflammatory bowel disease, ? Crohn's. [facility] asked to admit for further evaluation.   Assessment/Plan:    Principal Problem:   Jaundice - transaminitis with elevated bili - hepatitis panel so far negative  - EBV, CMV, autoimmune serologies pending  - appreciate GI team assistance, plan for colonoscopy in AM  Active Problems:   ? IBD - ? Elevated ceruloplasmin level - plan for colonoscopy in AM    Hypokalemia - supplemented and WNL  DVT prophylaxis - SCD's   Code Status: Full.  Family Communication:  plan of care discussed with the patient Disposition Plan: Home by 3/21  IV access:  Peripheral IV  Procedures and diagnostic studies:    Ct Abdomen Pelvis W Contrast 05/09/2015  Marked irregular thickening of the walls of the distal small bowel (terminal ileum) and cecum, consistent with a fairly severe enterocolitis of infectious or inflammatory nature, highly suspicious by location for Crohn's disease. No associated bowel obstruction. No abscess collection seen. No evidence of bowel perforation seen. Thickening of  the walls of the proximal appendix suggests involvement, but distal appendix appears normal in caliber with no evidence of appendicitis. 2. Remainder of the abdomen and pelvis is unremarkable. Liver appears normal. Gallbladder appears normal. No bile duct dilatation.   Medical Consultants:  GI  Other Consultants:  None  IAnti-Infectives:   Cipro 3/16 --> Flagyl 3/16 -->  MAGICK-Jeffrie Stander, MD  [facility] Pager [phone]  If 7PM-7AM, please contact night-coverage [email or web address] Password [email or web address] 05/12/2015, 11:33 AM  HPI/Subjective: No events overnight.   Objective: Filed Vitals:   05/11/15 1615 05/11/15 1953 05/12/15 0633 05/12/15 0937  BP: 116/58 117/62 137/68 95/52  Pulse: 76 73 63 66  Temp: 98.7 F (37.1 C) 97.7 F (36.5 C) 97.5 F (36.4 C) 97.9 F (36.6 C)  TempSrc: Oral Oral Oral Oral  Resp: 19 20 18 19  Height:      Weight:  85.2 kg (187 lb 13.3 oz)    SpO2: 99% 99% 99% 100%    Intake/Output Summary (Last 24 hours) at 05/12/15 1133 Last data filed at 05/12/15 0937  Gross per 24 hour  Intake   1580 ml  Output    300 ml  Net   1280 ml   Exam:   General:  Pt is alert, follows commands appropriately, not in acute distress, less jaundiced   Cardiovascular: Regular rate and rhythm, S1/S2, no murmurs, no rubs, no gallops  Respiratory: Clear to auscultation bilaterally, no wheezing, no crackles, no rhonchi  Abdomen: Soft, non distended, bowel sounds present, no guarding  Extremities: pulses   DP and PT palpable bilaterally  Neuro: Grossly nonfocal  Data Reviewed: Basic Metabolic Panel:  Recent Labs Lab 05/09/15 1617 05/10/15 0714 05/11/15 0715 05/12/15 0534  NA 138 139 137 141  K 5.7* 3.3* 3.6 3.7  CL 102 104 105 105  CO2 21* 26 24 25  GLUCOSE 71 89 94 100*  BUN <5* <5* <5* 6  CREATININE 0.80 0.86 0.69 0.66  CALCIUM 9.2 8.9 8.7* 9.1   Liver Function Tests:  Recent Labs Lab 05/09/15 1617 05/10/15 0714 05/11/15 0715 05/12/15 0534  AST 126* 95*  95* 123*  ALT 177* 153* 143* 183*  ALKPHOS 151* 154* 144* 167*  BILITOT 8.6* 8.0* 4.8* 3.1*  PROT 8.4* 7.6 6.7 7.0  ALBUMIN 3.4* 3.0* 2.7* 2.7*    Recent Labs Lab 05/09/15 1617  LIPASE 71*   CBC:  Recent Labs Lab 05/09/15 1617 05/10/15 0714 05/11/15 0715 05/12/15 0534  WBC 7.1 8.6 6.4 8.2  HGB 13.3 12.7* 11.5* 12.0*  HCT 39.8 37.5* 33.8* 35.1*  MCV 84.9 85.6 85.4 85.8  PLT 227 276 267 332   Scheduled Meds: . bisacodyl  10 mg Oral Once  . ciprofloxacin  400 mg Intravenous Q12H  . hydrOXYzine  25 mg Oral BID  . metronidazole  500 mg Intravenous Q8H  . peg 3350 powder  1 kit Oral Once  . venlafaxine  75 mg Oral TID WC   Continuous Infusions:          

## 2015-05-13 NOTE — Transfer of Care (Signed)
Immediate Anesthesia Transfer of Care Note  Patient: Mike Greer  Procedure(s) Performed: Procedure(s): COLONOSCOPY (N/A)  Patient Location: Endoscopy Unit  Anesthesia Type:MAC  Level of Consciousness: awake and alert   Airway & Oxygen Therapy: Patient Spontanous Breathing and Patient connected to nasal cannula oxygen  Post-op Assessment: Report given to RN, Post -op Vital signs reviewed and stable and Patient moving all extremities  Post vital signs: Reviewed and stable  Last Vitals:  Filed Vitals:   05/13/15 0957 05/13/15 1000  BP: 102/53 102/53  Pulse: 69 59  Temp: 36.4 C   Resp: 15 16    Complications: No apparent anesthesia complications

## 2015-05-13 NOTE — Anesthesia Procedure Notes (Signed)
Procedure Name: MAC Date/Time: 05/13/2015 9:20 AM Performed by: Suzy Bouchard Pre-anesthesia Checklist: Patient identified, Timeout performed, Emergency Drugs available, Suction available and Patient being monitored Patient Re-evaluated:Patient Re-evaluated prior to inductionOxygen Delivery Method: Simple face mask Intubation Type: IV induction

## 2015-05-13 NOTE — Op Note (Signed)
[facility] Patient Name: Mike Greer Procedure Date : 05/13/2015 MRN: [id number] Attending MD: Arta Silence , MD Date of Birth: 17-Jul-1980 CSN: [id number] Age: 35 Admit Type: Inpatient Procedure:                Colonoscopy Indications:              Last colonoscopy: 2015, Abdominal pain in the right                            lower quadrant, Abnormal CT of the GI tract Providers:                Arta Silence, MD, Cleda Daub, RN, Cherylynn Ridges, Technician Referring MD:              Medicines:                Propofol per Anesthesia Complications:            No immediate complications. Estimated Blood Loss:     Estimated blood loss was minimal. Procedure:                Pre-Anesthesia Assessment:                           - Prior to the procedure, a History and Physical                            was performed, and patient medications and                            allergies were reviewed. The patient's tolerance of                            previous anesthesia was also reviewed. The risks                            and benefits of the procedure and the sedation                            options and risks were discussed with the patient.                            All questions were answered, and informed consent                            was obtained. Prior Anticoagulants: The patient has                            taken no previous anticoagulant or antiplatelet                            agents. ASA Grade Assessment: II - A patient with  mild systemic disease. After reviewing the risks                            and benefits, the patient was deemed in                            satisfactory condition to undergo the procedure.                           After obtaining informed consent, the colonoscope                            was passed under direct vision. Throughout the   procedure, the patient's blood pressure, pulse, and                            oxygen saturations were monitored continuously. The                            [id number] scope was introduced through                            the anus and advanced to the the cecum, identified                            by appendiceal orifice and ileocecal valve. The                            ileocecal valve, appendiceal orifice, and rectum                            were photographed. The entire colon was examined.                            The colonoscopy was performed without difficulty.                            The patient tolerated the procedure well. The                            quality of the bowel preparation was good. Scope In: 9:21:56 AM Scope Out: 9:47:14 AM Scope Withdrawal Time: 0 hours 20 minutes 37 seconds  Total Procedure Duration: 0 hours 25 minutes 18 seconds  Findings:      The perianal examination was normal.      Many sessile polyps were found in the recto-sigmoid colon. The polyps       were diminutive in size, and looked hyperplastic in nature. These were       biopsied with a cold forceps for histology.      A patchy area of moderately congested, erythematous, ulcerated and       vascular-pattern-decreased mucosa was found in the cecum and at the       ileocecal valve. Biopsies were taken with a cold forceps for histology.       Unclear whether this represents Crohn's disease or infectious  enterocolitis. There was lots of edema at the cecum and edema with       ulceration at the ileocecal valve, and could not traverse the valve to       reach the terminal ileum.      A few medium-mouthed diverticula were found in the ascending colon. Some       subtle inflammation around some of these diverticula.      The exam was otherwise normal throughout the examined colon.      The retroflexed view of the distal rectum and anal verge was normal and       showed no anal or  rectal abnormalities. Impression:               - Many diminutive polyps at the recto-sigmoid                            colon. Biopsied.                           - Congested, erythematous, ulcerated and                            vascular-pattern-decreased mucosa in the cecum and                            at the ileocecal valve. Biopsied. Unclear whether                            this represents Crohn's disease or infectious                            enterocolitis.                           - Diverticulosis in the ascending colon. Subtle                            inflammation, likely part of the overall                            inflammatory versus infectious process, doubt this                            is diverticulitis.                           - The distal rectum and anal verge are normal on                            retroflexion view.                           - The examination was otherwise normal. Moderate Sedation:      None Recommendation:           - Patient has a contact number available for  emergencies. The signs and symptoms of potential                            delayed complications were discussed with the                            patient. Return to normal activities tomorrow.                            Written discharge instructions were provided to the                            patient.                           - Return patient to hospital ward for ongoing care.                           - Await pathology results, prior to consideration                            of Crohn's therapy; I am not readily convinced,                            based solely on colonoscopy appearance and his                            hospital improvement despite steroids, whether this                            is inflammatory bowel disease.                           - Continue antibiotics.                           - Mechanical soft diet until further  notice.                           - Continue present medications.                           - Follow LFTs; if AST/ALT continue to worsen, and                            pending serologies are unrevealing, might have to                            consider liver biopsy.                           [facility] will follow.                           - Repeat colonoscopy at appointment to be scheduled  for surveillance based on pathology results. Procedure Code(s):        --- Professional ---                           [id number], Colonoscopy, flexible; with biopsy, single                            or multiple Diagnosis Code(s):        --- Professional ---                           D12.7, Benign neoplasm of rectosigmoid junction                           K63.3, Ulcer of intestine                           K63.89, Other specified diseases of intestine                           R10.31, Right lower quadrant pain                           K57.30, Diverticulosis of large intestine without                            perforation or abscess without bleeding                           R93.3, Abnormal findings on diagnostic imaging of                            other parts of digestive tract CPT copyright 2016 [employer]. All rights reserved. The codes documented in this report are preliminary and upon coder review may  be revised to meet current compliance requirements. Arta Silence, MD Arta Silence, MD 05/13/2015 9:59:30 AM This report has been signed electronically. Number of Addenda: 0

## 2015-05-13 NOTE — Progress Notes (Signed)
Pharmacy Antibiotic Note  Mike Greer is a 36 y.o. male admitted on 05/09/2015 with r/o intra-abdominal infection. Pharmacy has been consulted for Cipro dosing.  Plan: 1. Continue Cipro 400 mg IV every 12 hours 2. Continue Flagyl per MD 3. Pharmacy will sign off of Cipro dosing consult as renal function stable and no dose adjustments are expected at this time.   Height: 5\' 10"  (177.8 cm) Weight: 188 lb 0.8 oz (85.3 kg) IBW/kg (Calculated) : 73  Temp (24hrs), Avg:97.8 F (36.6 C), Min:97.6 F (36.4 C), Max:98.2 F (36.8 C)   Recent Labs Lab 05/09/15 1617 05/09/15 2132 05/10/15 0714 05/11/15 0715 05/12/15 0534 05/13/15 0620  WBC 7.1  --  8.6 6.4 8.2 9.3  CREATININE 0.80  --  0.86 0.69 0.66 0.78  LATICACIDVEN  --  1.0  --   --   --   --     Estimated Creatinine Clearance: 134.3 mL/min (by C-G formula based on Cr of 0.78).    No Known Allergies  Antimicrobials this admission: Cipro 3/17>> Flagyl 3/17>>  Dose adjustments this admission: n/a  Microbiology results: No cx  Thank you for allowing pharmacy to be a part of this patient's care.  Alycia Rossetti, PharmD, BCPS Clinical Pharmacist Pager: [phone] 05/13/2015 2:02 PM

## 2015-05-14 ENCOUNTER — Encounter (HOSPITAL_COMMUNITY): Payer: Self-pay | Admitting: Gastroenterology

## 2015-05-14 DIAGNOSIS — E876 Hypokalemia: Secondary | ICD-10-CM | POA: Diagnosis not present

## 2015-05-14 LAB — CBC
HCT: 33.8 % — ABNORMAL LOW (ref 39.0–52.0)
Hemoglobin: 10.9 g/dL — ABNORMAL LOW (ref 13.0–17.0)
MCH: 27.5 pg (ref 26.0–34.0)
MCHC: 32.2 g/dL (ref 30.0–36.0)
MCV: 85.4 fL (ref 78.0–100.0)
PLATELETS: 398 10*3/uL (ref 150–400)
RBC: 3.96 MIL/uL — ABNORMAL LOW (ref 4.22–5.81)
RDW: 13.7 % (ref 11.5–15.5)
WBC: 9.9 10*3/uL (ref 4.0–10.5)

## 2015-05-14 LAB — COMPREHENSIVE METABOLIC PANEL
ALK PHOS: 158 U/L — AB (ref 38–126)
ALT: 205 U/L — AB (ref 17–63)
AST: 122 U/L — AB (ref 15–41)
Albumin: 2.8 g/dL — ABNORMAL LOW (ref 3.5–5.0)
Anion gap: 10 (ref 5–15)
BUN: 7 mg/dL (ref 6–20)
CALCIUM: 8.9 mg/dL (ref 8.9–10.3)
CHLORIDE: 105 mmol/L (ref 101–111)
CO2: 25 mmol/L (ref 22–32)
CREATININE: 0.82 mg/dL (ref 0.61–1.24)
GFR calc Af Amer: >60 mL/min (ref >60)
Glucose, Bld: 92 mg/dL (ref 65–99)
Potassium: 3.6 mmol/L (ref 3.5–5.1)
Sodium: 140 mmol/L (ref 135–145)
Total Bilirubin: 2 mg/dL — ABNORMAL HIGH (ref 0.3–1.2)
Total Protein: 6.8 g/dL (ref 6.5–8.1)

## 2015-05-14 LAB — MRSA PCR SCREENING: MRSA BY PCR: NEGATIVE

## 2015-05-14 MED ORDER — METRONIDAZOLE 500 MG PO TABS
500.0000 mg | ORAL_TABLET | Freq: Three times a day (TID) | ORAL | Status: DC
  Administered 2015-05-14 – 2015-05-15 (×3): 500 mg via ORAL
  Filled 2015-05-14 (×3): qty 1

## 2015-05-14 MED ORDER — CIPROFLOXACIN HCL 500 MG PO TABS
500.0000 mg | ORAL_TABLET | Freq: Two times a day (BID) | ORAL | Status: DC
  Administered 2015-05-14 – 2015-05-15 (×3): 500 mg via ORAL
  Filled 2015-05-14 (×3): qty 1

## 2015-05-14 NOTE — Progress Notes (Signed)
Subjective: Abdominal pain continues to improve. Tolerating soft diet.  Objective: Vital signs in last 24 hours: Temp:  [97.6 F (36.4 C)-98.8 F (37.1 C)] 98.8 F (37.1 C) (03/21 0848) Pulse Rate:  [58-77] 71 (03/21 0848) Resp:  [15-20] 20 (03/21 0848) BP: (102-113)/(53-72) 111/61 mmHg (03/21 0848) SpO2:  [93 %-100 %] 100 % (03/21 0848) Weight change:  Last BM Date: 05/13/15  PE: GEN:  NAD HEENT:  Mild scleral icterus ABD:  Soft, minimal lower abdominal tenderness  Lab Results: CBC    Component Value Date/Time   WBC 9.9 05/14/2015 0405   RBC 3.96* 05/14/2015 0405   HGB 10.9* 05/14/2015 0405   HCT 33.8* 05/14/2015 0405   PLT 398 05/14/2015 0405   MCV 85.4 05/14/2015 0405   MCH 27.5 05/14/2015 0405   MCHC 32.2 05/14/2015 0405   RDW 13.7 05/14/2015 0405   LYMPHSABS 1.5 04/27/2014 0042   MONOABS 0.6 04/27/2014 0042   EOSABS 0.2 04/27/2014 0042   BASOSABS 0.0 04/27/2014 0042   CMP     Component Value Date/Time   NA 140 05/14/2015 0405   K 3.6 05/14/2015 0405   CL 105 05/14/2015 0405   CO2 25 05/14/2015 0405   GLUCOSE 92 05/14/2015 0405   BUN 7 05/14/2015 0405   CREATININE 0.82 05/14/2015 0405   CALCIUM 8.9 05/14/2015 0405   PROT 6.8 05/14/2015 0405   ALBUMIN 2.8* 05/14/2015 0405   AST 122* 05/14/2015 0405   ALT 205* 05/14/2015 0405   ALKPHOS 158* 05/14/2015 0405   BILITOT 2.0* 05/14/2015 0405   GFRNONAA >60 05/14/2015 0405   GFRAA >60 05/14/2015 0405   Assessment:  1.  Right lower quadrant abdominal pain, improving on antibiotics. 2.  Ileocecal edema and mild ulceration, on colonoscopy, biopsies pending. 3.  Elevated LFTs, downtrending.  Serologies unrevealing re: cause.  Suspect transient infectious process.  Plan:  1.  Transition to oral antibiotics, to complete 10-day total course of therapy. 2.  Advance diet to regular. 3.  Given downtrend in LFTs, doubt need for liver biopsy at the present time. 4.  Awaiting colon biopsies. 5.  Possibly can be  discharged home tomorrow, pending continued LFT trend and colon biopsy findings.   Landry Dyke 05/14/2015, 9:46 AM   Pager [phone] If no answer or after 5 PM call [phone]

## 2015-05-14 NOTE — Progress Notes (Signed)
Patient ID: Mike Greer, male   DOB: 1980/04/28, 35 y.o.   MRN: [id number]  [facility] PROGRESS NOTE  Mike Greer [id number] DOB: [birth year] DOA: 05/09/2015 PCP: No primary care provider on file.   Brief narrative:    35 y.o. male presented to [facility] ED for evaluation of jaundice several days in duration, associated with right lower quadrant abd pain that has been intermittent and occasionally but not consistently radiating to the epigastric area, worse with certain types of food but pt can not exactly recall which food makes it worse. He reports having colonoscopy done two years ago when he was in jail but was told he had some polyps that were removed (Dr. Raliegh Ip wrote in his admission note that colitis was noted at that time).   In ED, pt noted to be hemodynamically stable, VSS, blood work notable for mild transaminitis with bili 8. CT abd and pelvis worrisome for ? Inflammatory bowel disease, ? Crohn's. [facility] asked to admit for further evaluation.   Assessment/Plan:    Principal Problem:   Jaundice - transaminitis with elevated bili, LFT's rending down  - hepatitis panel negative  - ? Transient infectious etiology  - Ileocecal edema and mild ulceration, on colonoscopy, biopsies pending - Transition to oral antibiotics, to complete 10-day total course of therapy.  Active Problems:   Hypokalemia - supplemented and WNL  DVT prophylaxis - SCD's   Code Status: Full.  Family Communication:  plan of care discussed with the patient Disposition Plan: Home by 3/22  IV access:  Peripheral IV  Procedures and diagnostic studies:    Ct Abdomen Pelvis W Contrast 05/09/2015  Marked irregular thickening of the walls of the distal small bowel (terminal ileum) and cecum, consistent with a fairly severe enterocolitis of infectious or inflammatory nature, highly suspicious by location for Crohn's disease. No associated bowel obstruction. No abscess collection seen. No evidence of bowel  perforation seen. Thickening of the walls of the proximal appendix suggests involvement, but distal appendix appears normal in caliber with no evidence of appendicitis. 2. Remainder of the abdomen and pelvis is unremarkable. Liver appears normal. Gallbladder appears normal. No bile duct dilatation.   Medical Consultants:  GI  Other Consultants:  None  IAnti-Infectives:   Cipro 3/16 --> Flagyl 3/16 -->  Mike Ramsay, MD  [facility] Pager [phone]  If 7PM-7AM, please contact night-coverage [email or web address] Password [facility] 05/14/2015, 12:14 PM  HPI/Subjective: No events overnight.   Objective: Filed Vitals:   05/13/15 1841 05/13/15 2046 05/14/15 0425 05/14/15 0848  BP: 113/63 110/70 107/55 111/61  Pulse: 70 77 62 71  Temp: 98.4 F (36.9 C) 98 F (36.7 C) 98.5 F (36.9 C) 98.8 F (37.1 C)  TempSrc: Oral Oral Oral Oral  Resp: 18 18 19 20   Height:      Weight:      SpO2: 100% 100% 100% 100%    Intake/Output Summary (Last 24 hours) at 05/14/15 1214 Last data filed at 05/14/15 0848  Gross per 24 hour  Intake   2180 ml  Output      0 ml  Net   2180 ml   Exam:   General:  Pt is alert, follows commands appropriately, not in acute distress, less jaundiced   Cardiovascular: Regular rate and rhythm, S1/S2, no murmurs, no rubs, no gallops  Respiratory: Clear to auscultation bilaterally, no wheezing, no crackles, no rhonchi  Abdomen: Soft, non distended, bowel sounds present, no guarding  Extremities: pulses DP and PT palpable bilaterally  Neuro: Grossly nonfocal  Data Reviewed: Basic Metabolic Panel:  Recent Labs Lab 05/10/15 0714 05/11/15 0715 05/12/15 0534 05/13/15 0620 05/14/15 0405  NA 139 137 141 141 140  K 3.3* 3.6 3.7 3.7 3.6  CL 104 105 105 105 105  CO2 26 24 25 22 25   GLUCOSE 89 94 100* 91 92  BUN <5* <5* 6 <5* 7  CREATININE 0.86 0.69 0.66 0.78 0.82  CALCIUM 8.9 8.7* 9.1 9.0 8.9   Liver Function Tests:  Recent Labs Lab 05/10/15 0714  05/11/15 0715 05/12/15 0534 05/13/15 0620 05/14/15 0405  AST 95* 95* 123* 153* 122*  ALT 153* 143* 183* 218* 205*  ALKPHOS 154* 144* 167* 173* 158*  BILITOT 8.0* 4.8* 3.1* 2.6* 2.0*  PROT 7.6 6.7 7.0 7.3 6.8  ALBUMIN 3.0* 2.7* 2.7* 2.9* 2.8*    Recent Labs Lab 05/09/15 1617  LIPASE 71*   CBC:  Recent Labs Lab 05/10/15 0714 05/11/15 0715 05/12/15 0534 05/13/15 0620 05/14/15 0405  WBC 8.6 6.4 8.2 9.3 9.9  HGB 12.7* 11.5* 12.0* 11.4* 10.9*  HCT 37.5* 33.8* 35.1* 33.8* 33.8*  MCV 85.6 85.4 85.8 85.4 85.4  PLT 276 267 332 380 398   Scheduled Meds: . ciprofloxacin  500 mg Oral BID  . hydrOXYzine  25 mg Oral BID  . metroNIDAZOLE  500 mg Oral 3 times per day  . venlafaxine  75 mg Oral TID WC   Continuous Infusions:

## 2015-05-15 DIAGNOSIS — R945 Abnormal results of liver function studies: Secondary | ICD-10-CM

## 2015-05-15 DIAGNOSIS — R7989 Other specified abnormal findings of blood chemistry: Secondary | ICD-10-CM

## 2015-05-15 MED ORDER — TRAMADOL HCL 50 MG PO TABS: 50.0000 mg | ORAL_TABLET | Freq: Four times a day (QID) | ORAL | Status: AC | PRN

## 2015-05-15 MED ORDER — CIPROFLOXACIN HCL 500 MG PO TABS
500.0000 mg | ORAL_TABLET | Freq: Two times a day (BID) | ORAL | Status: AC
Start: 2015-05-15 — End: ?

## 2015-05-15 MED ORDER — OXYCODONE HCL 5 MG PO TABS: 5.0000 mg | ORAL_TABLET | ORAL | Status: DC | PRN

## 2015-05-15 MED ORDER — METRONIDAZOLE 500 MG PO TABS
500.0000 mg | ORAL_TABLET | Freq: Three times a day (TID) | ORAL | Status: AC
Start: 2015-05-15 — End: ?

## 2015-05-15 MED ORDER — HYDROXYZINE HCL 25 MG PO TABS: 25.0000 mg | ORAL_TABLET | Freq: Two times a day (BID) | ORAL | Status: AC

## 2015-05-15 NOTE — Progress Notes (Signed)
Subjective: Minimal abdominal pain. Tolerating diet.  Objective: Vital signs in last 24 hours: Temp:  [97.5 F (36.4 C)-98.2 F (36.8 C)] 97.6 F (36.4 C) (03/22 0834) Pulse Rate:  [55-72] 55 (03/22 0834) Resp:  [18-19] 18 (03/22 0834) BP: (112-141)/(55-86) 121/64 mmHg (03/22 0834) SpO2:  [99 %-100 %] 100 % (03/22 0834) Weight change:  Last BM Date: 05/13/15  PE: GEN:  NAD ABD:  Soft, minimal tenderness, no peritonitis.  Lab Results: CBC    Component Value Date/Time   WBC 9.9 05/14/2015 0405   RBC 3.96* 05/14/2015 0405   HGB 10.9* 05/14/2015 0405   HCT 33.8* 05/14/2015 0405   PLT 398 05/14/2015 0405   MCV 85.4 05/14/2015 0405   MCH 27.5 05/14/2015 0405   MCHC 32.2 05/14/2015 0405   RDW 13.7 05/14/2015 0405   LYMPHSABS 1.5 04/27/2014 0042   MONOABS 0.6 04/27/2014 0042   EOSABS 0.2 04/27/2014 0042   BASOSABS 0.0 04/27/2014 0042   CMP     Component Value Date/Time   NA 140 05/14/2015 0405   K 3.6 05/14/2015 0405   CL 105 05/14/2015 0405   CO2 25 05/14/2015 0405   GLUCOSE 92 05/14/2015 0405   BUN 7 05/14/2015 0405   CREATININE 0.82 05/14/2015 0405   CALCIUM 8.9 05/14/2015 0405   PROT 6.8 05/14/2015 0405   ALBUMIN 2.8* 05/14/2015 0405   AST 122* 05/14/2015 0405   ALT 205* 05/14/2015 0405   ALKPHOS 158* 05/14/2015 0405   BILITOT 2.0* 05/14/2015 0405   GFRNONAA >60 05/14/2015 0405   GFRAA >60 05/14/2015 0405   Colon biopsies:  Non-specific ulceration, IBD not excluded  Assessment:  1. Right lower quadrant abdominal pain, improving on antibiotics.  Infectious versus Crohn's disease.  Improving without IBD therapy. 2. Ileocecal edema and mild ulceration, on colonoscopy, biopsies non-specific ulceration. 3. Elevated LFTs, downtrending. Serologies unrevealing re: cause. Suspect transient infectious process.  Plan:  1.  Advance diet as tolerated. 2.  OK to be discharged from GI perspective, as long as today's LFTs show general downtrend. 3.  I will need  to follow-up with patient in office in 3-4 weeks.  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 4.  10 day total course of ciprofloxacin and metronidazole. 5.  No steroids. 6.  Will sign-off; please call with questions; thank you for the consultation.   Landry Dyke 05/15/2015, 9:25 AM   Pager [phone] If no answer or after 5 PM call [phone]

## 2015-05-15 NOTE — Progress Notes (Signed)
Patient complained of not hearing on his left ear.Assessed visually-normal.Patient able to hears well from out conversation without complaining of it it nor requesting repetition from our conversations as well directions from other staffs.M.D. Made aware.

## 2015-05-15 NOTE — Discharge Instructions (Signed)
°  Colitis °Colitis is inflammation of the colon. Colitis may last a short time (acute) or it may last a long time (chronic). °CAUSES °This condition may be caused by: °· Viruses. °· Bacteria. °· Reactions to medicine. °· Certain autoimmune diseases, such as Crohn disease or ulcerative colitis. °SYMPTOMS °Symptoms of this condition include: °· Diarrhea. °· Passing bloody or tarry stool. °· Pain. °· Fever. °· Vomiting. °· Tiredness (fatigue). °· Weight loss. °· Bloating. °· Sudden increase in abdominal pain. °· Having fewer bowel movements than usual. °DIAGNOSIS °This condition is diagnosed with a stool test or a blood test. You may also have other tests, including X-rays, a CT scan, or a colonoscopy. °TREATMENT °Treatment may include: °· Resting the bowel. This involves not eating or drinking for a period of time. °· Fluids that are given through an IV tube. °· Medicine for pain and diarrhea. °· Antibiotic medicines. °· Cortisone medicines. °· Surgery. °HOME CARE INSTRUCTIONS °Eating and Drinking °· Follow instructions from your health care provider about eating or drinking restrictions. °· Drink enough fluid to keep your urine clear or pale yellow. °· Work with a dietitian to determine which foods cause your condition to flare up. °· Avoid foods that cause flare-ups. °· Eat a well-balanced diet. °Medicines °· Take over-the-counter and prescription medicines only as told by your health care provider. °· If you were prescribed an antibiotic medicine, take it as told by your health care provider. Do not stop taking the antibiotic even if you start to feel better. °General Instructions °· Keep all follow-up visits as told by your health care provider. This is important. °SEEK MEDICAL CARE IF: °· Your symptoms do not go away. °· You develop new symptoms. °SEEK IMMEDIATE MEDICAL CARE IF: °· You have a fever that does not go away with treatment. °· You develop chills. °· You have extreme weakness, fainting, or  dehydration. °· You have repeated vomiting. °· You develop severe pain in your abdomen. °· You pass bloody or tarry stool. °  °This information is not intended to replace advice given to you by your health care provider. Make sure you discuss any questions you have with your health care provider. °  °Document Released: 03/19/2004 Document Revised: 10/31/2014 Document Reviewed: 06/04/2014 °Elsevier Interactive Patient Education ©2016 [employer] ° °

## 2015-05-15 NOTE — Discharge Summary (Addendum)
Physician Discharge Summary  Mike Greer [id number] DOB: Sep 18, 1980 DOA: 05/09/2015  PCP: PT currently in jail and sees provider there   Admit date: 05/09/2015 Discharge date: 05/15/2015  Recommendations for Outpatient Follow-up: 1. Pt will need to follow up with PCP in 2-3 weeks post discharge  2. Please obtain CMP to evaluate electrolytes and kidney and liver function 3. Please also check CBC to evaluate Hg and Hct levels 4. Pt will need to follow-up with GI in office in 3-4 weeks (Dr. [facility]) 5. Complete therapy with Cipro and Flagyl   Discharge Diagnoses:  Principal Problem:   Jaundice Active Problems:   Hyperbilirubinemia   Colitis   Hypokalemia  Discharge Condition: Stable  Diet recommendation: Regular diet    Brief narrative:    35 y.o. male presented to [facility] ED for evaluation of jaundice several days in duration, associated with right lower quadrant abd pain that has been intermittent and occasionally but not consistently radiating to the epigastric area, worse with certain types of food but pt can not exactly recall which food makes it worse. He reports having colonoscopy done two years ago when he was in jail but was told he had some polyps that were removed (Dr. Raliegh Greer wrote in his admission note that colitis was noted at that time).   In ED, pt noted to be hemodynamically stable, VSS, blood work notable for mild transaminitis with bili 8. CT abd and pelvis worrisome for ? Inflammatory bowel disease, ? Crohn's. [facility] asked to admit for further evaluation.   Assessment/Plan:    Principal Problem:  Jaundice, likely infectious in etiology, infectious colitis  - transaminitis with elevated bili, LFT's rending down  - hepatitis panel negative  - ? Transient infectious etiology  - Ileocecal edema and mild ulceration, on colonoscopy, Bx of both cecum and recto - cecal area biopsied per report - Transition to oral antibiotics, to complete  therapy - outpatient follow up with GI doctor   Active Problems:  Hypokalemia - supplemented and WNL   Code Status: Full.  Family Communication: plan of care discussed with the patient  IV access:  Peripheral IV  Procedures and diagnostic studies:   Ct Abdomen Pelvis W Contrast 05/09/2015 Marked irregular thickening of the walls of the distal small bowel (terminal ileum) and cecum, consistent with a fairly severe enterocolitis of infectious or inflammatory nature, highly suspicious by location for Crohn's disease. No associated bowel obstruction. No abscess collection seen. No evidence of bowel perforation seen. Thickening of the walls of the proximal appendix suggests involvement, but distal appendix appears normal in caliber with no evidence of appendicitis. 2. Remainder of the abdomen and pelvis is unremarkable. Liver appears normal. Gallbladder appears normal. No bile duct dilatation.   Medical Consultants:  GI  Other Consultants:  None  IAnti-Infectives:   Cipro 3/16 --> Flagyl 3/16 -->  Mike Ramsay, MD [facility] Pager [phone]  If 7PM-7AM, please contact night-coverage [email or web address] Password [email or web address]     Discharge Exam: Filed Vitals:   05/15/15 0634 05/15/15 0834  BP: 141/86 121/64  Pulse: 57 55  Temp: 97.5 F (36.4 C) 97.6 F (36.4 C)  Resp: 18 18   Filed Vitals:   05/14/15 1744 05/14/15 2033 05/15/15 0634 05/15/15 0834  BP: 112/55 113/57 141/86 121/64  Pulse: 72 67 57 55  Temp: 98.2 F (36.8 C) 97.9 F (36.6 C) 97.5 F (36.4 C) 97.6 F (36.4 C)  TempSrc: Oral Oral Oral Oral  Resp: 19 18 18  18  Height:      Weight:      SpO2: 99% 100% 100% 100%    General: Pt is alert, follows commands appropriately, not in acute distress Cardiovascular: Regular rate and rhythm, S1/S2 +, no murmurs, no rubs, no gallops Respiratory: Clear to auscultation bilaterally, no wheezing, no crackles, no rhonchi Abdominal: Soft, non tender,  non distended, bowel sounds +, no guarding Extremities: no edema, no cyanosis, pulses palpable bilaterally DP and PT Neuro: Grossly nonfocal  Discharge Instructions  Discharge Instructions    Diet - low sodium heart healthy    Complete by:  As directed      Increase activity slowly    Complete by:  As directed             Medication List    STOP taking these medications        ibuprofen 200 MG tablet  Commonly known as:  ADVIL,MOTRIN      TAKE these medications        ciprofloxacin 500 MG tablet  Commonly known as:  CIPRO  Take 1 tablet (500 mg total) by mouth 2 (two) times daily.     hydrOXYzine 25 MG tablet  Commonly known as:  ATARAX/VISTARIL  Take 1 tablet (25 mg total) by mouth 2 (two) times daily.     metroNIDAZOLE 500 MG tablet  Commonly known as:  FLAGYL  Take 1 tablet (500 mg total) by mouth every 8 (eight) hours.     traMADol 50 MG tablet  Commonly known as:  ULTRAM  Take 1 tablet (50 mg total) by mouth every 6 (six) hours as needed.     venlafaxine 75 MG tablet  Commonly known as:  EFFEXOR  Take 75 mg by mouth 3 (three) times daily.            Follow-up Information    Call Mike Ramsay, MD.   Specialty:  Internal Medicine   Why:  As needed   Contact information:   [address] [place] [address] [phone]        The results of significant diagnostics from this hospitalization (including imaging, microbiology, ancillary and laboratory) are listed below for reference.     Microbiology: Recent Results (from the past 240 hour(s))  MRSA PCR Screening     Status: None   Collection Time: 05/14/15  4:34 AM  Result Value Ref Range Status   MRSA by PCR NEGATIVE NEGATIVE Final    Comment:        The GeneXpert MRSA Assay (FDA approved for NASAL specimens only), is one component of a comprehensive MRSA colonization surveillance program. It is not intended to diagnose MRSA infection nor to guide or monitor  treatment for MRSA infections.      Labs: Basic Metabolic Panel:  Recent Labs Lab 05/10/15 0714 05/11/15 0715 05/12/15 0534 05/13/15 0620 05/14/15 0405  NA 139 137 141 141 140  K 3.3* 3.6 3.7 3.7 3.6  CL 104 105 105 105 105  CO2 26 24 25 22 25   GLUCOSE 89 94 100* 91 92  BUN <5* <5* 6 <5* 7  CREATININE 0.86 0.69 0.66 0.78 0.82  CALCIUM 8.9 8.7* 9.1 9.0 8.9   Liver Function Tests:  Recent Labs Lab 05/10/15 0714 05/11/15 0715 05/12/15 0534 05/13/15 0620 05/14/15 0405  AST 95* 95* 123* 153* 122*  ALT 153* 143* 183* 218* 205*  ALKPHOS 154* 144* 167* 173* 158*  BILITOT 8.0* 4.8* 3.1* 2.6* 2.0*  PROT 7.6 6.7  7.0 7.3 6.8  ALBUMIN 3.0* 2.7* 2.7* 2.9* 2.8*    Recent Labs Lab 05/09/15 1617  LIPASE 71*   CBC:  Recent Labs Lab 05/10/15 0714 05/11/15 0715 05/12/15 0534 05/13/15 0620 05/14/15 0405  WBC 8.6 6.4 8.2 9.3 9.9  HGB 12.7* 11.5* 12.0* 11.4* 10.9*  HCT 37.5* 33.8* 35.1* 33.8* 33.8*  MCV 85.6 85.4 85.8 85.4 85.4  PLT 276 267 332 380 398   SIGNED: Time coordinating discharge: 30 minutes  MAGICK-Nyzir Dubois, MD  [facility] 05/15/2015, 10:51 AM Pager [phone]  If 7PM-7AM, please contact night-coverage [email or web address] Password [email or web address]

## 2015-10-26 IMAGING — CR DG CHEST 1V PORT
2 series · 2 of 2 positions shown · non-contrast
Comparison: None.

CLINICAL DATA: Chest pain.

EXAM:
PORTABLE CHEST - 1 VIEW

[AP (1 of 2)]
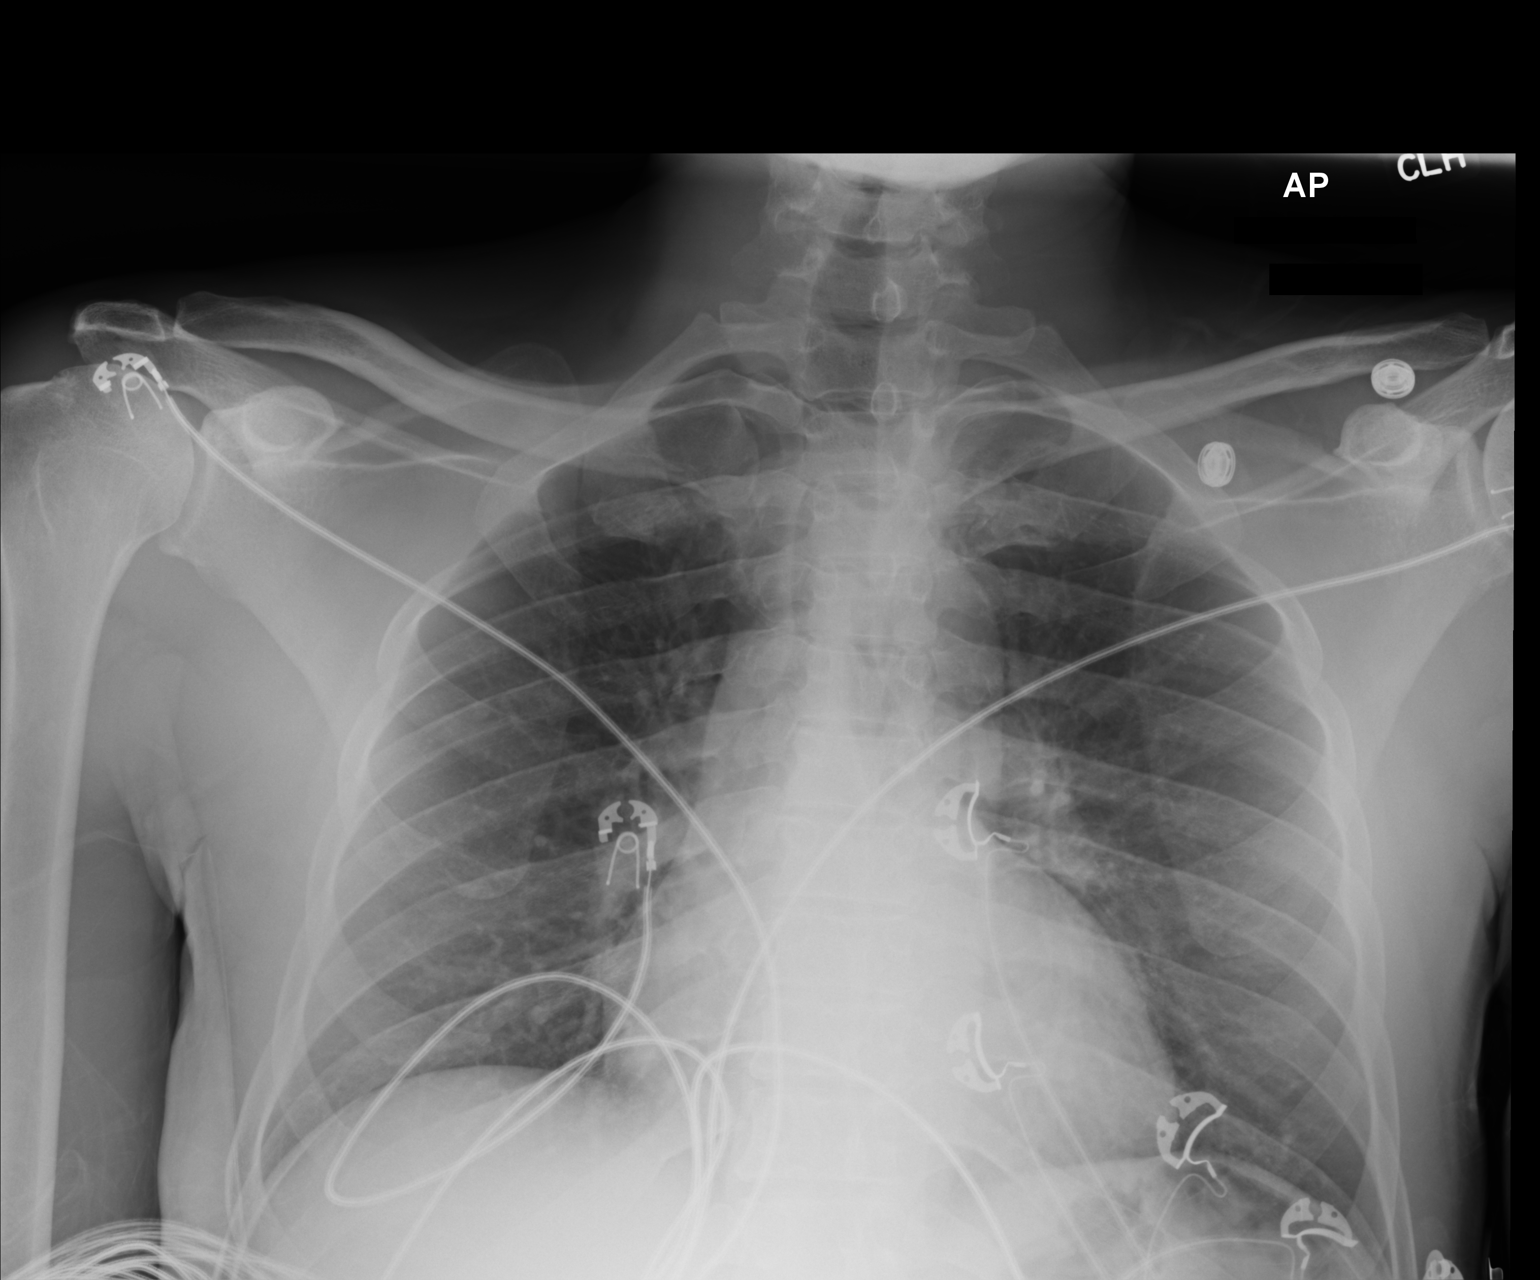

[AP (2 of 2)]
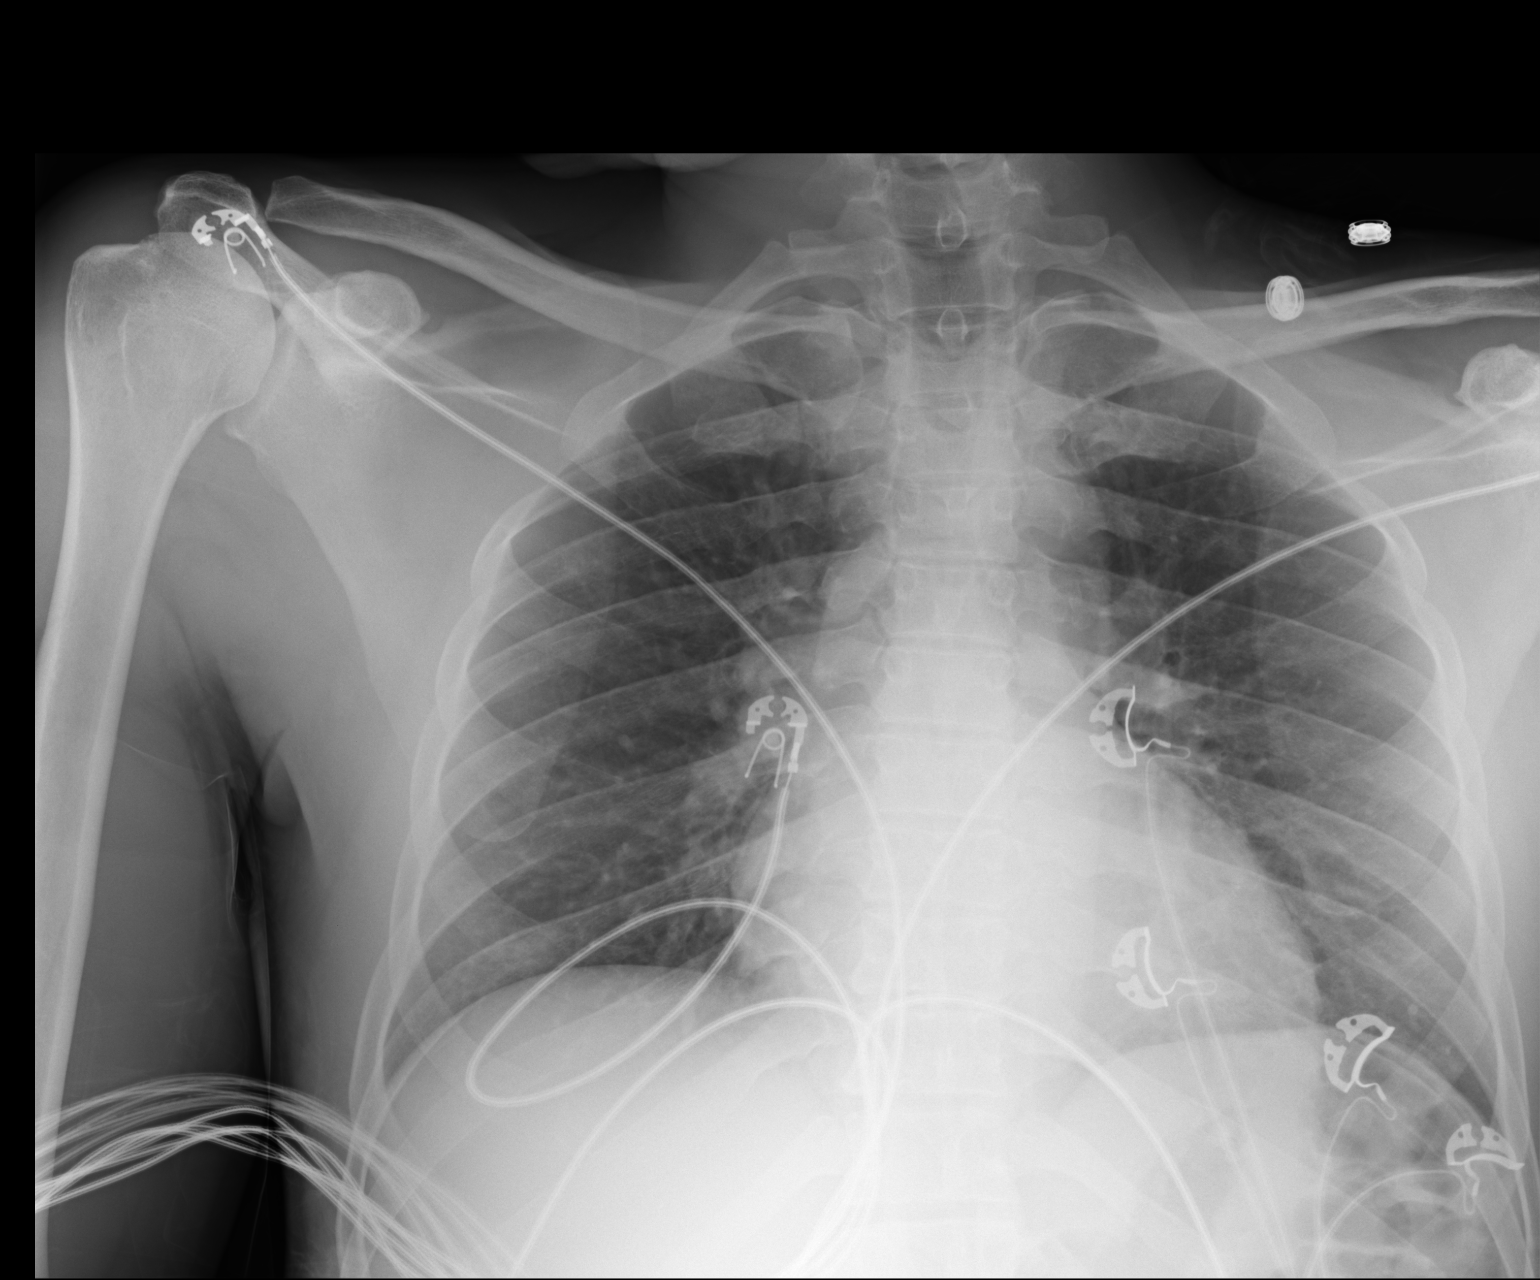

[2 of 2 positions shown; findings below may reference images not displayed]

FINDINGS: The heart size and mediastinal contours are within normal limits.
Both lungs are clear. The visualized skeletal structures are
unremarkable.
IMPRESSION: No active disease.

## 2016-01-23 ENCOUNTER — Emergency Department (HOSPITAL_COMMUNITY)
Admission: EM | Admit: 2016-01-23 | Discharge: 2016-01-24 | Disposition: A | Discharge status: Home / Self Care | Payer: Self-pay | Attending: Emergency Medicine | Admitting: Emergency Medicine

## 2016-01-23 ENCOUNTER — Encounter (HOSPITAL_COMMUNITY): Payer: Self-pay | Admitting: Emergency Medicine

## 2016-01-23 DIAGNOSIS — F1910 Other psychoactive substance abuse, uncomplicated: Secondary | ICD-10-CM

## 2016-01-23 DIAGNOSIS — J45909 Unspecified asthma, uncomplicated: Secondary | ICD-10-CM | POA: Insufficient documentation

## 2016-01-23 DIAGNOSIS — F17210 Nicotine dependence, cigarettes, uncomplicated: Secondary | ICD-10-CM | POA: Insufficient documentation

## 2016-01-23 DIAGNOSIS — E162 Hypoglycemia, unspecified: Secondary | ICD-10-CM

## 2016-01-23 DIAGNOSIS — Z79899 Other long term (current) drug therapy: Secondary | ICD-10-CM | POA: Insufficient documentation

## 2016-01-23 DIAGNOSIS — F1410 Cocaine abuse, uncomplicated: Secondary | ICD-10-CM | POA: Insufficient documentation

## 2016-01-23 NOTE — ED Triage Notes (Addendum)
Pt found by [employer] who called [facility] for pt who appear under the influence of unknown substance. Found sitting under tree partially clothed and speaking incoherently.
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
## 2016-01-24 LAB — URINE MICROSCOPIC-ADD ON: Bacteria, UA: NONE SEEN

## 2016-01-24 LAB — COMPREHENSIVE METABOLIC PANEL
ALT: 34 U/L (ref 17–63)
AST: 46 U/L — AB (ref 15–41)
Albumin: 4.6 g/dL (ref 3.5–5.0)
Alkaline Phosphatase: 75 U/L (ref 38–126)
Anion gap: 14 (ref 5–15)
BUN: 15 mg/dL (ref 6–20)
CHLORIDE: 103 mmol/L (ref 101–111)
CO2: 21 mmol/L — ABNORMAL LOW (ref 22–32)
CREATININE: 1.06 mg/dL (ref 0.61–1.24)
Calcium: 9.2 mg/dL (ref 8.9–10.3)
GFR calc Af Amer: >60 mL/min (ref >60)
Glucose, Bld: 63 mg/dL — ABNORMAL LOW (ref 65–99)
Potassium: 3.5 mmol/L (ref 3.5–5.1)
Sodium: 138 mmol/L (ref 135–145)
Total Bilirubin: 1.1 mg/dL (ref 0.3–1.2)
Total Protein: 8.7 g/dL — ABNORMAL HIGH (ref 6.5–8.1)

## 2016-01-24 LAB — CBC WITH DIFFERENTIAL/PLATELET
BASOS ABS: 0 10*3/uL (ref 0.0–0.1)
Basophils Relative: 0 %
EOS PCT: 0 %
Eosinophils Absolute: 0.1 10*3/uL (ref 0.0–0.7)
HEMATOCRIT: 44.5 % (ref 39.0–52.0)
Hemoglobin: 15.5 g/dL (ref 13.0–17.0)
LYMPHS PCT: 12 %
Lymphs Abs: 2.3 10*3/uL (ref 0.7–4.0)
MCH: 31.3 pg (ref 26.0–34.0)
MCHC: 34.8 g/dL (ref 30.0–36.0)
MCV: 89.7 fL (ref 78.0–100.0)
Monocytes Absolute: 1.2 10*3/uL — ABNORMAL HIGH (ref 0.1–1.0)
Monocytes Relative: 6 %
NEUTROS ABS: 15.7 10*3/uL — AB (ref 1.7–7.7)
Neutrophils Relative %: 82 %
PLATELETS: 354 10*3/uL (ref 150–400)
RBC: 4.96 MIL/uL (ref 4.22–5.81)
RDW: 13.5 % (ref 11.5–15.5)
WBC: 19.3 10*3/uL — AB (ref 4.0–10.5)

## 2016-01-24 LAB — RAPID URINE DRUG SCREEN, HOSP PERFORMED
AMPHETAMINES: NOT DETECTED
BARBITURATES: NOT DETECTED
Benzodiazepines: NOT DETECTED
Cocaine: POSITIVE — AB
Opiates: NOT DETECTED
TETRAHYDROCANNABINOL: POSITIVE — AB

## 2016-01-24 LAB — URINALYSIS, ROUTINE W REFLEX MICROSCOPIC
BILIRUBIN URINE: NEGATIVE
GLUCOSE, UA: NEGATIVE mg/dL
Ketones, ur: NEGATIVE mg/dL
Leukocytes, UA: NEGATIVE
Nitrite: NEGATIVE
PH: 5.5 (ref 5.0–8.0)
Protein, ur: 30 mg/dL — AB
SPECIFIC GRAVITY, URINE: 1.025 (ref 1.005–1.030)

## 2016-01-24 LAB — AMMONIA: AMMONIA: 35 umol/L (ref 9–35)

## 2016-01-24 LAB — I-STAT TROPONIN, ED: TROPONIN I, POC: 0.01 ng/mL (ref 0.00–0.08)

## 2016-01-24 LAB — CBG MONITORING, ED
GLUCOSE-CAPILLARY: 58 mg/dL — AB (ref 65–99)
Glucose-Capillary: 146 mg/dL — ABNORMAL HIGH (ref 65–99)

## 2016-01-24 LAB — ETHANOL: ALCOHOL ETHYL (B): 18 mg/dL — AB (ref <5)

## 2016-01-24 MED ORDER — DEXTROSE 50 % IV SOLN
25.0000 g | Freq: Once | INTRAVENOUS | Status: AC
  Administered 2016-01-24: 25 g via INTRAVENOUS
  Filled 2016-01-24: qty 50

## 2016-01-24 MED ORDER — SODIUM CHLORIDE 0.9 % IV BOLUS (SEPSIS)
1000.0000 mL | Freq: Once | INTRAVENOUS | Status: AC
  Administered 2016-01-24: 1000 mL via INTRAVENOUS

## 2016-01-24 NOTE — ED Notes (Signed)
MD in room.  Pt given several cups of water and a sandwich.

## 2016-01-24 NOTE — ED Notes (Addendum)
Pt refuses to ambulate saying that he feels too weak.  Pt continues to be uncooperative when asked about medications or history. MD aware.

## 2016-01-24 NOTE — ED Notes (Signed)
Pt ambulated by two RNs.  Pt's gait steady, complains of soreness in his legs but is able to stay upright with minimal to no assistance.  Pt A&Ox4.

## 2016-01-24 NOTE — Discharge Instructions (Signed)
You were found to have low blood sugar. This is most likely from poor oral intake. Be sure to eat regular meals and stay well hydrated. It is also recommended that you stop using drugs. This also could be a contributing factor to your symptoms this evening.

## 2016-01-24 NOTE — ED Notes (Signed)
RN notified of blood glucose levels of 58.

## 2017-02-11 ENCOUNTER — Emergency Department (HOSPITAL_COMMUNITY)
Admission: EM | Admit: 2017-02-11 | Discharge: 2017-02-11 | Disposition: A | Discharge status: Home / Self Care | Payer: No Typology Code available for payment source | Attending: Emergency Medicine | Admitting: Emergency Medicine

## 2017-02-11 ENCOUNTER — Other Ambulatory Visit: Payer: Self-pay

## 2017-02-11 ENCOUNTER — Encounter (HOSPITAL_COMMUNITY): Payer: Self-pay

## 2017-02-11 DIAGNOSIS — N50811 Right testicular pain: Secondary | ICD-10-CM | POA: Diagnosis present

## 2017-02-11 DIAGNOSIS — Z5321 Procedure and treatment not carried out due to patient leaving prior to being seen by health care provider: Secondary | ICD-10-CM | POA: Diagnosis not present

## 2017-02-11 LAB — CBC WITH DIFFERENTIAL/PLATELET
BASOS ABS: 0 10*3/uL (ref 0.0–0.1)
Basophils Relative: 0 %
Eosinophils Absolute: 0.2 10*3/uL (ref 0.0–0.7)
Eosinophils Relative: 2 %
HEMATOCRIT: 39 % (ref 39.0–52.0)
Hemoglobin: 13 g/dL (ref 13.0–17.0)
LYMPHS ABS: 2.1 10*3/uL (ref 0.7–4.0)
LYMPHS PCT: 18 %
MCH: 30.9 pg (ref 26.0–34.0)
MCHC: 33.3 g/dL (ref 30.0–36.0)
MCV: 92.6 fL (ref 78.0–100.0)
MONO ABS: 0.9 10*3/uL (ref 0.1–1.0)
Monocytes Relative: 7 %
NEUTROS ABS: 8.4 10*3/uL — AB (ref 1.7–7.7)
Neutrophils Relative %: 73 %
PLATELETS: 357 10*3/uL (ref 150–400)
RBC: 4.21 MIL/uL — AB (ref 4.22–5.81)
RDW: 12.7 % (ref 11.5–15.5)
WBC: 11.6 10*3/uL — AB (ref 4.0–10.5)

## 2017-02-11 LAB — BASIC METABOLIC PANEL
ANION GAP: 8 (ref 5–15)
BUN: 11 mg/dL (ref 6–20)
CO2: 23 mmol/L (ref 22–32)
Calcium: 9 mg/dL (ref 8.9–10.3)
Chloride: 105 mmol/L (ref 101–111)
Creatinine, Ser: 1.06 mg/dL (ref 0.61–1.24)
GFR calc Af Amer: >60 mL/min (ref >60)
Glucose, Bld: 88 mg/dL (ref 65–99)
POTASSIUM: 3.5 mmol/L (ref 3.5–5.1)
Sodium: 136 mmol/L (ref 135–145)

## 2017-02-11 LAB — URINALYSIS, ROUTINE W REFLEX MICROSCOPIC
BILIRUBIN URINE: NEGATIVE
Glucose, UA: NEGATIVE mg/dL
Hgb urine dipstick: NEGATIVE
Ketones, ur: NEGATIVE mg/dL
Nitrite: NEGATIVE
PH: 5 (ref 5.0–8.0)
PROTEIN: NEGATIVE mg/dL
Specific Gravity, Urine: 1.02 (ref 1.005–1.030)
Squamous Epithelial / LPF: NONE SEEN

## 2017-02-11 NOTE — ED Triage Notes (Signed)
Pt presents with onset of R testicular swelling and pain that began yesterday.  Pt reports dysuria, has had intercourse since then without any difficulty.  Pt also reports R lower abdominal pain that pt reports is colitis; last bowel movement was yesterday.  Pt reports similar symptoms x 2 times.

## 2017-02-11 NOTE — ED Notes (Signed)
No answer for vitals  

## 2017-09-20 ENCOUNTER — Emergency Department (HOSPITAL_COMMUNITY): Payer: Self-pay

## 2017-09-20 ENCOUNTER — Inpatient Hospital Stay (HOSPITAL_COMMUNITY)
Admission: EM | Admit: 2017-09-20 | Discharge: 2017-09-21 | DRG: 513 | Payer: Self-pay | Attending: Orthopedic Surgery | Admitting: Orthopedic Surgery

## 2017-09-20 ENCOUNTER — Encounter (HOSPITAL_COMMUNITY): Admission: EM | Payer: Self-pay | Source: Home / Self Care | Attending: Orthopedic Surgery

## 2017-09-20 ENCOUNTER — Emergency Department (HOSPITAL_COMMUNITY): Payer: Self-pay | Admitting: Certified Registered Nurse Anesthetist

## 2017-09-20 ENCOUNTER — Other Ambulatory Visit: Payer: Self-pay

## 2017-09-20 ENCOUNTER — Encounter (HOSPITAL_COMMUNITY): Payer: Self-pay | Admitting: *Deleted

## 2017-09-20 ENCOUNTER — Observation Stay (HOSPITAL_COMMUNITY): Payer: Self-pay

## 2017-09-20 DIAGNOSIS — R402240 Coma scale, best verbal response, confused conversation, unspecified time: Secondary | ICD-10-CM | POA: Diagnosis present

## 2017-09-20 DIAGNOSIS — S62521B Displaced fracture of distal phalanx of right thumb, initial encounter for open fracture: Principal | ICD-10-CM | POA: Diagnosis present

## 2017-09-20 DIAGNOSIS — S6291XB Unspecified fracture of right wrist and hand, initial encounter for open fracture: Secondary | ICD-10-CM

## 2017-09-20 DIAGNOSIS — R402360 Coma scale, best motor response, obeys commands, unspecified time: Secondary | ICD-10-CM | POA: Diagnosis present

## 2017-09-20 DIAGNOSIS — R456 Violent behavior: Secondary | ICD-10-CM | POA: Diagnosis present

## 2017-09-20 DIAGNOSIS — S6981XA Other specified injuries of right wrist, hand and finger(s), initial encounter: Secondary | ICD-10-CM | POA: Diagnosis present

## 2017-09-20 DIAGNOSIS — F1910 Other psychoactive substance abuse, uncomplicated: Secondary | ICD-10-CM | POA: Diagnosis present

## 2017-09-20 DIAGNOSIS — R402130 Coma scale, eyes open, to sound, unspecified time: Secondary | ICD-10-CM | POA: Diagnosis present

## 2017-09-20 DIAGNOSIS — G92 Toxic encephalopathy: Secondary | ICD-10-CM | POA: Diagnosis present

## 2017-09-20 DIAGNOSIS — R0602 Shortness of breath: Secondary | ICD-10-CM

## 2017-09-20 DIAGNOSIS — E876 Hypokalemia: Secondary | ICD-10-CM | POA: Diagnosis present

## 2017-09-20 DIAGNOSIS — S61239A Puncture wound without foreign body of unspecified finger without damage to nail, initial encounter: Secondary | ICD-10-CM

## 2017-09-20 DIAGNOSIS — S62624B Displaced fracture of medial phalanx of right ring finger, initial encounter for open fracture: Secondary | ICD-10-CM | POA: Diagnosis present

## 2017-09-20 DIAGNOSIS — Y9301 Activity, walking, marching and hiking: Secondary | ICD-10-CM | POA: Diagnosis present

## 2017-09-20 DIAGNOSIS — S71142A Puncture wound with foreign body, left thigh, initial encounter: Secondary | ICD-10-CM | POA: Diagnosis present

## 2017-09-20 DIAGNOSIS — Y92410 Unspecified street and highway as the place of occurrence of the external cause: Secondary | ICD-10-CM

## 2017-09-20 DIAGNOSIS — Z781 Physical restraint status: Secondary | ICD-10-CM

## 2017-09-20 DIAGNOSIS — S71131A Puncture wound without foreign body, right thigh, initial encounter: Secondary | ICD-10-CM | POA: Diagnosis present

## 2017-09-20 DIAGNOSIS — Z23 Encounter for immunization: Secondary | ICD-10-CM

## 2017-09-20 DIAGNOSIS — R4182 Altered mental status, unspecified: Secondary | ICD-10-CM

## 2017-09-20 DIAGNOSIS — F17200 Nicotine dependence, unspecified, uncomplicated: Secondary | ICD-10-CM | POA: Diagnosis present

## 2017-09-20 DIAGNOSIS — W3400XA Accidental discharge from unspecified firearms or gun, initial encounter: Secondary | ICD-10-CM

## 2017-09-20 HISTORY — PX: AMPUTATION FINGER: SHX6594

## 2017-09-20 HISTORY — PX: I & D EXTREMITY: SHX5045

## 2017-09-20 HISTORY — PX: DRESSING CHANGE UNDER ANESTHESIA: SHX5237

## 2017-09-20 LAB — COMPREHENSIVE METABOLIC PANEL
ALBUMIN: 3.6 g/dL (ref 3.5–5.0)
ALK PHOS: 61 U/L (ref 38–126)
ALK PHOS: 65 U/L (ref 38–126)
ALT: 16 U/L (ref 0–44)
ALT: 18 U/L (ref 0–44)
ANION GAP: 10 (ref 5–15)
ANION GAP: 7 (ref 5–15)
AST: 24 U/L (ref 15–41)
AST: 24 U/L (ref 15–41)
Albumin: 3.5 g/dL (ref 3.5–5.0)
BILIRUBIN TOTAL: 1 mg/dL (ref 0.3–1.2)
BUN: 11 mg/dL (ref 6–20)
BUN: 5 mg/dL — ABNORMAL LOW (ref 6–20)
CALCIUM: 8.9 mg/dL (ref 8.9–10.3)
CALCIUM: 8.9 mg/dL (ref 8.9–10.3)
CHLORIDE: 106 mmol/L (ref 98–111)
CO2: 22 mmol/L (ref 22–32)
CO2: 26 mmol/L (ref 22–32)
CREATININE: 0.96 mg/dL (ref 0.61–1.24)
Chloride: 109 mmol/L (ref 98–111)
Creatinine, Ser: 1.12 mg/dL (ref 0.61–1.24)
GFR calc non Af Amer: >60 mL/min (ref >60)
GLUCOSE: 147 mg/dL — AB (ref 70–99)
Glucose, Bld: 117 mg/dL — ABNORMAL HIGH (ref 70–99)
POTASSIUM: 3.3 mmol/L — AB (ref 3.5–5.1)
Potassium: 3.5 mmol/L (ref 3.5–5.1)
SODIUM: 138 mmol/L (ref 135–145)
Sodium: 142 mmol/L (ref 135–145)
TOTAL PROTEIN: 7.1 g/dL (ref 6.5–8.1)
Total Bilirubin: 0.7 mg/dL (ref 0.3–1.2)
Total Protein: 6.6 g/dL (ref 6.5–8.1)

## 2017-09-20 LAB — PREPARE FRESH FROZEN PLASMA
UNIT DIVISION: 0
Unit division: 0

## 2017-09-20 LAB — TYPE AND SCREEN
ABO/RH(D): A POS
ANTIBODY SCREEN: NEGATIVE
UNIT DIVISION: 0
UNIT DIVISION: 0

## 2017-09-20 LAB — BPAM RBC
Blood Product Expiration Date: 201908172359
Blood Product Expiration Date: 201908182359
ISSUE DATE / TIME: 201907290427
ISSUE DATE / TIME: 201907290427
UNIT TYPE AND RH: 9500
UNIT TYPE AND RH: 9500

## 2017-09-20 LAB — CBC
HCT: 38.1 % — ABNORMAL LOW (ref 39.0–52.0)
HEMATOCRIT: 42.3 % (ref 39.0–52.0)
HEMOGLOBIN: 12.5 g/dL — AB (ref 13.0–17.0)
HEMOGLOBIN: 13.9 g/dL (ref 13.0–17.0)
MCH: 31.3 pg (ref 26.0–34.0)
MCH: 31.2 pg (ref 26.0–34.0)
MCHC: 32.8 g/dL (ref 30.0–36.0)
MCHC: 32.9 g/dL (ref 30.0–36.0)
MCV: 95.3 fL (ref 78.0–100.0)
MCV: 95.1 fL (ref 78.0–100.0)
Platelets: 227 10*3/uL (ref 150–400)
Platelets: 245 10*3/uL (ref 150–400)
RBC: 4 MIL/uL — AB (ref 4.22–5.81)
RBC: 4.45 MIL/uL (ref 4.22–5.81)
RDW: 12.1 % (ref 11.5–15.5)
RDW: 12.5 % (ref 11.5–15.5)
WBC: 12.8 10*3/uL — ABNORMAL HIGH (ref 4.0–10.5)
WBC: 17.9 10*3/uL — ABNORMAL HIGH (ref 4.0–10.5)

## 2017-09-20 LAB — BPAM FFP
Blood Product Expiration Date: 201908162359
Blood Product Expiration Date: 201908172359
ISSUE DATE / TIME: 201907290428
ISSUE DATE / TIME: 201907290428
UNIT TYPE AND RH: 600
Unit Type and Rh: 6200

## 2017-09-20 LAB — RAPID URINE DRUG SCREEN, HOSP PERFORMED
Amphetamines: NOT DETECTED
BARBITURATES: NOT DETECTED
Benzodiazepines: NOT DETECTED
Cocaine: POSITIVE — AB
Opiates: POSITIVE — AB
Tetrahydrocannabinol: POSITIVE — AB

## 2017-09-20 LAB — GLUCOSE, CAPILLARY
GLUCOSE-CAPILLARY: 131 mg/dL — AB (ref 70–99)
GLUCOSE-CAPILLARY: 145 mg/dL — AB (ref 70–99)

## 2017-09-20 LAB — PROTIME-INR
INR: 1.02
PROTHROMBIN TIME: 13.3 s (ref 11.4–15.2)

## 2017-09-20 LAB — URINALYSIS, ROUTINE W REFLEX MICROSCOPIC
Bilirubin Urine: NEGATIVE
GLUCOSE, UA: NEGATIVE mg/dL
KETONES UR: NEGATIVE mg/dL
LEUKOCYTES UA: NEGATIVE
Nitrite: NEGATIVE
Protein, ur: NEGATIVE mg/dL
Specific Gravity, Urine: 1.006 (ref 1.005–1.030)
pH: 7 (ref 5.0–8.0)

## 2017-09-20 LAB — CK: CK TOTAL: 828 U/L — AB (ref 49–397)

## 2017-09-20 LAB — ETHANOL

## 2017-09-20 LAB — I-STAT CG4 LACTIC ACID, ED: LACTIC ACID, VENOUS: 2.01 mmol/L — AB (ref 0.5–1.9)

## 2017-09-20 LAB — TSH: TSH: 0.662 u[IU]/mL (ref 0.350–4.500)

## 2017-09-20 LAB — BLOOD PRODUCT ORDER (VERBAL) VERIFICATION

## 2017-09-20 LAB — LACTIC ACID, PLASMA
LACTIC ACID, VENOUS: 0.8 mmol/L (ref 0.5–1.9)
Lactic Acid, Venous: 1 mmol/L (ref 0.5–1.9)

## 2017-09-20 LAB — ABO/RH: ABO/RH(D): A POS

## 2017-09-20 SURGERY — IRRIGATION AND DEBRIDEMENT EXTREMITY
Anesthesia: General | Site: Leg Upper | Laterality: Right

## 2017-09-20 MED ORDER — ACETAMINOPHEN 325 MG PO TABS
650.0000 mg | ORAL_TABLET | Freq: Four times a day (QID) | ORAL | Status: DC | PRN
Start: 2017-09-20 — End: 2017-09-21
  Administered 2017-09-21: 650 mg via ORAL
  Filled 2017-09-20: qty 2

## 2017-09-20 MED ORDER — OXYCODONE HCL 5 MG PO TABS: 5.0000 mg | ORAL_TABLET | ORAL | Status: DC | PRN

## 2017-09-20 MED ORDER — POTASSIUM CHLORIDE IN NACL 20-0.9 MEQ/L-% IV SOLN: INTRAVENOUS | Status: DC

## 2017-09-20 MED ORDER — DIPHENHYDRAMINE HCL 12.5 MG/5ML PO ELIX: 12.5000 mg | ORAL_SOLUTION | ORAL | Status: DC | PRN

## 2017-09-20 MED ORDER — LORAZEPAM 2 MG/ML IJ SOLN: 1.0000 mg | INTRAMUSCULAR | Status: DC | PRN

## 2017-09-20 MED ORDER — DEXAMETHASONE SODIUM PHOSPHATE 10 MG/ML IJ SOLN
INTRAMUSCULAR | Status: DC | PRN
  Administered 2017-09-20: 10 mg via INTRAVENOUS

## 2017-09-20 MED ORDER — MORPHINE SULFATE (PF) 4 MG/ML IV SOLN
8.0000 mg | Freq: Once | INTRAVENOUS | Status: AC
  Administered 2017-09-20: 8 mg via INTRAVENOUS

## 2017-09-20 MED ORDER — ONDANSETRON HCL 4 MG/2ML IJ SOLN
INTRAMUSCULAR | Status: DC | PRN
  Administered 2017-09-20: 4 mg via INTRAVENOUS

## 2017-09-20 MED ORDER — POLYETHYLENE GLYCOL 3350 17 G PO PACK: 17.0000 g | PACK | Freq: Every day | ORAL | Status: DC | PRN

## 2017-09-20 MED ORDER — DEXTROSE-NACL 5-0.45 % IV SOLN
INTRAVENOUS | Status: DC
  Administered 2017-09-21: 01:00:00 via INTRAVENOUS

## 2017-09-20 MED ORDER — HYDROMORPHONE HCL 1 MG/ML IJ SOLN: 1.0000 mg | INTRAMUSCULAR | Status: DC | PRN

## 2017-09-20 MED ORDER — DEXMEDETOMIDINE HCL IN NACL 200 MCG/50ML IV SOLN
INTRAVENOUS | Status: DC | PRN
  Administered 2017-09-20: 40 ug via INTRAVENOUS
  Administered 2017-09-20: 20 ug via INTRAVENOUS

## 2017-09-20 MED ORDER — MAGNESIUM CITRATE PO SOLN: 1.0000 | Freq: Once | ORAL | Status: DC | PRN

## 2017-09-20 MED ORDER — CEFAZOLIN SODIUM-DEXTROSE 2-3 GM-%(50ML) IV SOLR
INTRAVENOUS | Status: DC | PRN
  Administered 2017-09-20: 2 g via INTRAVENOUS

## 2017-09-20 MED ORDER — SENNA 8.6 MG PO TABS
1.0000 | ORAL_TABLET | Freq: Two times a day (BID) | ORAL | Status: DC
  Administered 2017-09-21: 8.6 mg via ORAL
  Filled 2017-09-20: qty 1

## 2017-09-20 MED ORDER — ZOLPIDEM TARTRATE 5 MG PO TABS: 5.0000 mg | ORAL_TABLET | Freq: Every evening | ORAL | Status: DC | PRN

## 2017-09-20 MED ORDER — FAMOTIDINE IN NACL 20-0.9 MG/50ML-% IV SOLN
20.0000 mg | Freq: Two times a day (BID) | INTRAVENOUS | Status: DC
  Administered 2017-09-20: 20 mg via INTRAVENOUS
  Filled 2017-09-20: qty 50

## 2017-09-20 MED ORDER — POVIDONE-IODINE 10 % EX SWAB: 2.0000 "application " | Freq: Once | CUTANEOUS | Status: DC

## 2017-09-20 MED ORDER — SODIUM CHLORIDE 0.9 % IR SOLN
Status: DC | PRN
  Administered 2017-09-20 (×2): 3000 mL

## 2017-09-20 MED ORDER — CHLORHEXIDINE GLUCONATE 4 % EX LIQD
60.0000 mL | Freq: Once | CUTANEOUS | Status: DC
  Filled 2017-09-20 (×2): qty 60

## 2017-09-20 MED ORDER — TETANUS-DIPHTH-ACELL PERTUSSIS 5-2.5-18.5 LF-MCG/0.5 IM SUSP
0.5000 mL | Freq: Once | INTRAMUSCULAR | Status: AC
  Administered 2017-09-20: 0.5 mL via INTRAMUSCULAR
  Filled 2017-09-20: qty 0.5

## 2017-09-20 MED ORDER — MIDAZOLAM HCL 2 MG/2ML IJ SOLN
INTRAMUSCULAR | Status: AC
  Filled 2017-09-20: qty 2

## 2017-09-20 MED ORDER — SODIUM CHLORIDE 0.9 % IR SOLN
Status: DC | PRN
  Administered 2017-09-20: 1000 mL

## 2017-09-20 MED ORDER — PROPOFOL 10 MG/ML IV BOLUS
INTRAVENOUS | Status: DC | PRN
  Administered 2017-09-20: 120 mg via INTRAVENOUS
  Administered 2017-09-20: 40 mg via INTRAVENOUS

## 2017-09-20 MED ORDER — FENTANYL CITRATE (PF) 250 MCG/5ML IJ SOLN
INTRAMUSCULAR | Status: DC | PRN
  Administered 2017-09-20: 50 ug via INTRAVENOUS
  Administered 2017-09-20: 25 ug via INTRAVENOUS
  Administered 2017-09-20 (×3): 50 ug via INTRAVENOUS
  Administered 2017-09-20: 25 ug via INTRAVENOUS

## 2017-09-20 MED ORDER — CEFAZOLIN SODIUM-DEXTROSE 1-4 GM/50ML-% IV SOLN
1.0000 g | Freq: Once | INTRAVENOUS | Status: AC
  Administered 2017-09-20: 1 g via INTRAVENOUS
  Filled 2017-09-20: qty 50

## 2017-09-20 MED ORDER — MORPHINE SULFATE (PF) 4 MG/ML IV SOLN
INTRAVENOUS | Status: AC
  Filled 2017-09-20: qty 2

## 2017-09-20 MED ORDER — HYDROMORPHONE HCL 1 MG/ML IJ SOLN: 0.2500 mg | INTRAMUSCULAR | Status: DC | PRN

## 2017-09-20 MED ORDER — DOCUSATE SODIUM 100 MG PO CAPS
100.0000 mg | ORAL_CAPSULE | Freq: Two times a day (BID) | ORAL | Status: DC
  Administered 2017-09-21: 100 mg via ORAL
  Filled 2017-09-20: qty 1

## 2017-09-20 MED ORDER — FENTANYL CITRATE (PF) 250 MCG/5ML IJ SOLN
INTRAMUSCULAR | Status: AC
  Filled 2017-09-20: qty 5

## 2017-09-20 MED ORDER — ONDANSETRON HCL 4 MG/2ML IJ SOLN: 4.0000 mg | Freq: Four times a day (QID) | INTRAMUSCULAR | Status: DC | PRN

## 2017-09-20 MED ORDER — LIDOCAINE 2% (20 MG/ML) 5 ML SYRINGE
INTRAMUSCULAR | Status: DC | PRN
  Administered 2017-09-20: 40 mg via INTRAVENOUS

## 2017-09-20 MED ORDER — LACTATED RINGERS IV SOLN
INTRAVENOUS | Status: DC
  Administered 2017-09-20 (×2): via INTRAVENOUS

## 2017-09-20 MED ORDER — DEXMEDETOMIDINE HCL IN NACL 200 MCG/50ML IV SOLN
INTRAVENOUS | Status: AC
  Filled 2017-09-20: qty 50

## 2017-09-20 MED ORDER — BISACODYL 10 MG RE SUPP: 10.0000 mg | Freq: Every day | RECTAL | Status: DC | PRN

## 2017-09-20 MED ORDER — MIDAZOLAM HCL 2 MG/2ML IJ SOLN
INTRAMUSCULAR | Status: DC | PRN
  Administered 2017-09-20: 2 mg via INTRAVENOUS

## 2017-09-20 MED ORDER — DEXMEDETOMIDINE HCL IN NACL 400 MCG/100ML IV SOLN
0.2000 ug/kg/h | INTRAVENOUS | Status: DC
  Administered 2017-09-20 (×2): 0.7 ug/kg/h via INTRAVENOUS
  Administered 2017-09-21: 0.6 ug/kg/h via INTRAVENOUS
  Filled 2017-09-20: qty 100

## 2017-09-20 MED ORDER — INSULIN ASPART 100 UNIT/ML ~~LOC~~ SOLN
1.0000 [IU] | SUBCUTANEOUS | Status: DC
  Administered 2017-09-20 – 2017-09-21 (×3): 1 [IU] via SUBCUTANEOUS

## 2017-09-20 MED ORDER — HYDROMORPHONE HCL 1 MG/ML IJ SOLN
0.5000 mg | INTRAMUSCULAR | Status: DC | PRN
  Administered 2017-09-21: 0.5 mg via INTRAVENOUS
  Filled 2017-09-20: qty 1

## 2017-09-20 MED ORDER — ZIPRASIDONE MESYLATE 20 MG IM SOLR
20.0000 mg | Freq: Once | INTRAMUSCULAR | Status: AC
  Administered 2017-09-20: 20 mg via INTRAMUSCULAR
  Filled 2017-09-20: qty 20

## 2017-09-20 MED ORDER — FOLIC ACID 1 MG PO TABS
1.0000 mg | ORAL_TABLET | Freq: Every day | ORAL | Status: DC
  Administered 2017-09-21: 1 mg via ORAL
  Filled 2017-09-20 (×2): qty 1

## 2017-09-20 MED ORDER — CEFAZOLIN SODIUM-DEXTROSE 2-4 GM/100ML-% IV SOLN: 2.0000 g | INTRAVENOUS | Status: DC

## 2017-09-20 MED ORDER — ONDANSETRON HCL 4 MG PO TABS
4.0000 mg | ORAL_TABLET | Freq: Four times a day (QID) | ORAL | Status: DC | PRN
Start: 2017-09-20 — End: 2017-09-21

## 2017-09-20 MED ORDER — STERILE WATER FOR INJECTION IJ SOLN
INTRAMUSCULAR | Status: AC
  Filled 2017-09-20: qty 10

## 2017-09-20 MED ORDER — DEXMEDETOMIDINE HCL IN NACL 200 MCG/50ML IV SOLN
0.4000 ug/kg/h | INTRAVENOUS | Status: DC
  Administered 2017-09-20: 0.4 ug/kg/h via INTRAVENOUS

## 2017-09-20 MED ORDER — PROPOFOL 10 MG/ML IV BOLUS
INTRAVENOUS | Status: AC
  Filled 2017-09-20: qty 20

## 2017-09-20 MED ORDER — THIAMINE HCL 100 MG/ML IJ SOLN
100.0000 mg | Freq: Every day | INTRAMUSCULAR | Status: DC
  Administered 2017-09-20: 100 mg via INTRAVENOUS
  Filled 2017-09-20: qty 2

## 2017-09-20 MED ORDER — ENOXAPARIN SODIUM 40 MG/0.4ML ~~LOC~~ SOLN
40.0000 mg | SUBCUTANEOUS | Status: DC
  Administered 2017-09-21: 40 mg via SUBCUTANEOUS
  Filled 2017-09-20: qty 0.4

## 2017-09-20 MED ORDER — ACETAMINOPHEN 650 MG RE SUPP
650.0000 mg | Freq: Four times a day (QID) | RECTAL | Status: DC | PRN
Start: 2017-09-20 — End: 2017-09-21

## 2017-09-20 MED ORDER — PHENYLEPHRINE 40 MCG/ML (10ML) SYRINGE FOR IV PUSH (FOR BLOOD PRESSURE SUPPORT)
PREFILLED_SYRINGE | INTRAVENOUS | Status: DC | PRN
  Administered 2017-09-20: 200 ug via INTRAVENOUS
  Administered 2017-09-20: 160 ug via INTRAVENOUS

## 2017-09-20 SURGICAL SUPPLY — 46 items
BANDAGE ACE 4X5 VEL STRL LF (GAUZE/BANDAGES/DRESSINGS) ×5 IMPLANT
BANDAGE ACE 6X5 VEL STRL LF (GAUZE/BANDAGES/DRESSINGS) ×5 IMPLANT
BANDAGE ELASTIC 4 VELCRO ST LF (GAUZE/BANDAGES/DRESSINGS) ×4 IMPLANT
BLADE SURG 15 STRL LF DISP TIS (BLADE) IMPLANT
BLADE SURG 15 STRL SS (BLADE) ×5
BNDG COHESIVE 4X5 TAN STRL (GAUZE/BANDAGES/DRESSINGS) ×5 IMPLANT
BNDG CONFORM 2 STRL LF (GAUZE/BANDAGES/DRESSINGS) ×2 IMPLANT
BNDG GAUZE ELAST 4 BULKY (GAUZE/BANDAGES/DRESSINGS) ×5 IMPLANT
BOOTCOVER CLEANROOM LRG (PROTECTIVE WEAR) ×10 IMPLANT
COVER SURGICAL LIGHT HANDLE (MISCELLANEOUS) ×5 IMPLANT
CUFF TOURNIQUET SINGLE 34IN LL (TOURNIQUET CUFF) IMPLANT
DURAPREP 26ML APPLICATOR (WOUND CARE) ×5 IMPLANT
ELECT REM PT RETURN 9FT ADLT (ELECTROSURGICAL)
ELECTRODE REM PT RTRN 9FT ADLT (ELECTROSURGICAL) IMPLANT
EVACUATOR 1/8 PVC DRAIN (DRAIN) IMPLANT
FLUID NSS /IRRIG 3000 ML XXX (IV SOLUTION) ×4 IMPLANT
GAUZE SPONGE 4X4 12PLY STRL (GAUZE/BANDAGES/DRESSINGS) ×5 IMPLANT
GAUZE SPONGE 4X4 12PLY STRL LF (GAUZE/BANDAGES/DRESSINGS) ×4 IMPLANT
GAUZE XEROFORM 1X8 LF (GAUZE/BANDAGES/DRESSINGS) ×9 IMPLANT
GLOVE BIOGEL PI ORTHO PRO SZ8 (GLOVE) ×2
GLOVE ORTHO TXT STRL SZ7.5 (GLOVE) ×5 IMPLANT
GLOVE PI ORTHO PRO STRL SZ8 (GLOVE) ×3 IMPLANT
GLOVE SURG ORTHO 8.0 STRL STRW (GLOVE) ×10 IMPLANT
GOWN STRL REUS W/ TWL LRG LVL3 (GOWN DISPOSABLE) IMPLANT
GOWN STRL REUS W/TWL LRG LVL3 (GOWN DISPOSABLE)
HANDPIECE INTERPULSE COAX TIP (DISPOSABLE)
KIT BASIN OR (CUSTOM PROCEDURE TRAY) ×5 IMPLANT
KIT TURNOVER KIT B (KITS) ×5 IMPLANT
MANIFOLD NEPTUNE II (INSTRUMENTS) ×5 IMPLANT
NS IRRIG 1000ML POUR BTL (IV SOLUTION) ×5 IMPLANT
PACK ORTHO EXTREMITY (CUSTOM PROCEDURE TRAY) ×5 IMPLANT
PAD ABD 8X10 STRL (GAUZE/BANDAGES/DRESSINGS) ×2 IMPLANT
PAD ARMBOARD 7.5X6 YLW CONV (MISCELLANEOUS) ×10 IMPLANT
SET CYSTO W/LG BORE CLAMP LF (SET/KITS/TRAYS/PACK) ×2 IMPLANT
SET HNDPC FAN SPRY TIP SCT (DISPOSABLE) IMPLANT
SPONGE LAP 18X18 X RAY DECT (DISPOSABLE) ×5 IMPLANT
STOCKINETTE IMPERVIOUS 9X36 MD (GAUZE/BANDAGES/DRESSINGS) ×5 IMPLANT
SUT ETHILON 3 0 PS 1 (SUTURE) ×6 IMPLANT
SWAB CULTURE ESWAB REG 1ML (MISCELLANEOUS) IMPLANT
TOWEL OR 17X24 6PK STRL BLUE (TOWEL DISPOSABLE) ×5 IMPLANT
TOWEL OR 17X26 10 PK STRL BLUE (TOWEL DISPOSABLE) ×5 IMPLANT
TUBE CONNECTING 12'X1/4 (SUCTIONS) ×1
TUBE CONNECTING 12X1/4 (SUCTIONS) ×4 IMPLANT
UNDERPAD 30X30 (UNDERPADS AND DIAPERS) ×5 IMPLANT
WATER STERILE IRR 1000ML POUR (IV SOLUTION) ×5 IMPLANT
YANKAUER SUCT BULB TIP NO VENT (SUCTIONS) ×5 IMPLANT
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
## 2017-09-20 NOTE — ED Notes (Signed)
Patient got out of restraint and was standing on the side of the stretcher , large amt. Of urine in floor. Patient cleaned and placed back on stretcher and restraints reapplied. Condom cath applied.
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
## 2017-09-20 NOTE — Anesthesia Preprocedure Evaluation (Signed)
Anesthesia Evaluation  Patient identified by MRN, date of birth, ID band Patient awake    Reviewed: Allergy & Precautions, H&P , NPO status , Patient's Chart, lab work & pertinent test results  Airway Mallampati: II  TM Distance: >3 FB Neck ROM: Full    Dental no notable dental hx. (+) Teeth Intact, Dental Advisory Given   Pulmonary Current Smoker,    Pulmonary exam normal breath sounds clear to auscultation       Cardiovascular negative cardio ROS   Rhythm:Regular Rate:Normal     Neuro/Psych negative neurological ROS  negative psych ROS   GI/Hepatic negative GI ROS, Neg liver ROS,   Endo/Other  negative endocrine ROS  Renal/GU negative Renal ROS  negative genitourinary   Musculoskeletal   Abdominal   Peds  Hematology negative hematology ROS (+)   Anesthesia Other Findings   Reproductive/Obstetrics negative OB ROS                             Anesthesia Physical Anesthesia Plan  ASA: II  Anesthesia Plan: General   Post-op Pain Management:    Induction: Intravenous  PONV Risk Score and Plan: 2 and Ondansetron, Dexamethasone and Midazolam  Airway Management Planned: LMA  Additional Equipment:   Intra-op Plan:   Post-operative Plan: Extubation in OR  Informed Consent: I have reviewed the patients History and Physical, chart, labs and discussed the procedure including the risks, benefits and alternatives for the proposed anesthesia with the patient or authorized representative who has indicated his/her understanding and acceptance.     Dental advisory given  Plan Discussed with: CRNA  Anesthesia Plan Comments:         Anesthesia Quick Evaluation  

## 2017-09-20 NOTE — Progress Notes (Signed)
Airway is patent. Patient is answering MD questions and following commands.

## 2017-09-20 NOTE — ED Notes (Signed)
Patient got off stretcher , standing up at bedside. Pulled dressings and monitor off again, reoriented patient. Placed back on stretcher. Continues to talk of people that aren't in room.
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
## 2017-09-20 NOTE — Anesthesia Procedure Notes (Signed)
Procedure Name: LMA Insertion Date/Time: 09/20/2017 2:39 PM Performed by: Imagene Riches, CRNA Pre-anesthesia Checklist: Patient identified, Emergency Drugs available, Suction available and Patient being monitored Patient Re-evaluated:Patient Re-evaluated prior to induction Oxygen Delivery Method: Circle System Utilized Preoxygenation: Pre-oxygenation with 100% oxygen Induction Type: IV induction Ventilation: Mask ventilation without difficulty LMA: LMA inserted LMA Size: 4.0 Number of attempts: 1 Airway Equipment and Method: Bite block Placement Confirmation: positive ETCO2 Tube secured with: Tape Dental Injury: Teeth and Oropharynx as per pre-operative assessment

## 2017-09-20 NOTE — ED Notes (Signed)
Restraints checked , good circulation, bilateral radial pulses present.

## 2017-09-20 NOTE — ED Notes (Signed)
Safety sitter at bedside 

## 2017-09-20 NOTE — ED Notes (Signed)
Dr Kathrynn Humble informed of lactic acid results 2.01
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
## 2017-09-20 NOTE — Anesthesia Postprocedure Evaluation (Signed)
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 AT THE IP JOINT (Right Finger) DRESSING CHANGE UNDER ANESTHESIA (Bilateral Leg Upper)     Patient location during evaluation: PACU Anesthesia Type: General Level of consciousness: awake and alert Pain management: pain level controlled Vital Signs Assessment: post-procedure vital signs reviewed and stable Respiratory status: spontaneous breathing, nonlabored ventilation, respiratory function stable and patient connected to nasal cannula oxygen Cardiovascular status: blood pressure returned to baseline and stable Postop Assessment: no apparent nausea or vomiting Anesthetic complications: no    Last Vitals:  Vitals:   09/20/17 1637 09/20/17 1645  BP: 140/84   Pulse: 72 67  Resp: 18 18  SpO2: 98% 99%    Last Pain:  Vitals:   09/20/17 0540  PainSc: 10-Worst pain ever                 Trygve Thal,W. EDMOND
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
## 2017-09-20 NOTE — ED Notes (Signed)
Patient is resting on stretcher at present. Safety sitter at bedside.
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
## 2017-09-21 ENCOUNTER — Other Ambulatory Visit: Payer: Self-pay

## 2017-09-21 ENCOUNTER — Encounter (HOSPITAL_COMMUNITY): Payer: Self-pay

## 2017-09-21 ENCOUNTER — Encounter (HOSPITAL_COMMUNITY): Payer: Self-pay | Admitting: Orthopedic Surgery

## 2017-09-21 ENCOUNTER — Inpatient Hospital Stay (HOSPITAL_COMMUNITY): Payer: Self-pay

## 2017-09-21 DIAGNOSIS — R4182 Altered mental status, unspecified: Secondary | ICD-10-CM | POA: Diagnosis not present

## 2017-09-21 LAB — AMMONIA: Ammonia: 82 umol/L — ABNORMAL HIGH (ref 9–35)

## 2017-09-21 LAB — GLUCOSE, CAPILLARY
GLUCOSE-CAPILLARY: 134 mg/dL — AB (ref 70–99)
Glucose-Capillary: 102 mg/dL — ABNORMAL HIGH (ref 70–99)
Glucose-Capillary: 112 mg/dL — ABNORMAL HIGH (ref 70–99)

## 2017-09-21 LAB — CBC
HCT: 41.3 % (ref 39.0–52.0)
HEMOGLOBIN: 13.5 g/dL (ref 13.0–17.0)
MCH: 31.3 pg (ref 26.0–34.0)
MCHC: 32.7 g/dL (ref 30.0–36.0)
MCV: 95.6 fL (ref 78.0–100.0)
PLATELETS: 247 10*3/uL (ref 150–400)
RBC: 4.32 MIL/uL (ref 4.22–5.81)
RDW: 12.2 % (ref 11.5–15.5)
WBC: 15.1 10*3/uL — ABNORMAL HIGH (ref 4.0–10.5)

## 2017-09-21 LAB — COMPREHENSIVE METABOLIC PANEL
ALBUMIN: 3.3 g/dL — AB (ref 3.5–5.0)
ALT: 15 U/L (ref 0–44)
AST: 19 U/L (ref 15–41)
Alkaline Phosphatase: 61 U/L (ref 38–126)
Anion gap: 10 (ref 5–15)
BILIRUBIN TOTAL: 0.8 mg/dL (ref 0.3–1.2)
BUN: 11 mg/dL (ref 6–20)
CHLORIDE: 109 mmol/L (ref 98–111)
CO2: 22 mmol/L (ref 22–32)
Calcium: 8.7 mg/dL — ABNORMAL LOW (ref 8.9–10.3)
Creatinine, Ser: 1 mg/dL (ref 0.61–1.24)
GFR calc Af Amer: >60 mL/min (ref >60)
GFR calc non Af Amer: >60 mL/min (ref >60)
Glucose, Bld: 131 mg/dL — ABNORMAL HIGH (ref 70–99)
POTASSIUM: 3.8 mmol/L (ref 3.5–5.1)
Sodium: 141 mmol/L (ref 135–145)
TOTAL PROTEIN: 6.6 g/dL (ref 6.5–8.1)

## 2017-09-21 LAB — MAGNESIUM: Magnesium: 2.2 mg/dL (ref 1.7–2.4)

## 2017-09-21 LAB — HIV ANTIBODY (ROUTINE TESTING W REFLEX): HIV SCREEN 4TH GENERATION: NONREACTIVE

## 2017-09-21 LAB — PROTIME-INR
INR: 1.12
Prothrombin Time: 14.3 seconds (ref 11.4–15.2)

## 2017-09-21 LAB — CK: CK TOTAL: 600 U/L — AB (ref 49–397)

## 2017-09-21 LAB — PHOSPHORUS: Phosphorus: 4 mg/dL (ref 2.5–4.6)

## 2017-09-21 LAB — TSH: TSH: 0.169 u[IU]/mL — AB (ref 0.350–4.500)

## 2017-09-21 LAB — T4, FREE: Free T4: 0.94 ng/dL (ref 0.82–1.77)

## 2017-09-21 MED ORDER — CEPHALEXIN 500 MG PO CAPS: 500.0000 mg | ORAL_CAPSULE | Freq: Four times a day (QID) | ORAL | 0 refills | Status: AC

## 2017-09-21 MED ORDER — ACETAMINOPHEN 325 MG PO TABS: 650.0000 mg | ORAL_TABLET | Freq: Four times a day (QID) | ORAL | 1 refills | Status: AC | PRN

## 2017-09-21 MED ORDER — CEPHALEXIN 500 MG PO CAPS: 500.0000 mg | ORAL_CAPSULE | Freq: Four times a day (QID) | ORAL | 0 refills | Status: DC

## 2017-09-21 MED ORDER — IBUPROFEN 800 MG PO TABS: 800.0000 mg | ORAL_TABLET | Freq: Three times a day (TID) | ORAL | 0 refills | Status: AC | PRN

## 2017-09-21 MED ORDER — VITAMIN B-1 100 MG PO TABS
100.0000 mg | ORAL_TABLET | Freq: Every day | ORAL | Status: DC
  Administered 2017-09-21: 100 mg via ORAL
  Filled 2017-09-21: qty 1
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
## 2017-09-21 NOTE — Progress Notes (Signed)
Notified by patient that he was having sharp chest pain in his right upper chest. The chest pain was a new sudden onset. Patient was not short of breath and all VSs were WNL's. An EKG was obtained and was normal sinus rhythm. Micheal, PA was notified. No new orders were placed. Patient was stable to be transported with [employer]. Will continue to monitor patient.
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
## 2017-09-22 LAB — CDS SEROLOGY

## 2022-07-03 NOTE — Other (Signed)
Procedure on Monday May 20th confirmed with facility. Pt to arrive for procedure at 0800.

## 2022-07-11 NOTE — Anesthesia Pre-Procedure Evaluation (Signed)
Department of Anesthesiology  Preprocedure Note       Name:  Tommy Woods   Age:  42 y.o.  DOB:  12/31/1980                                          MRN:  [id number]         Date:  07/11/2022      Surgeon: Moishe Spice):  Skeet Simmer, MD    Procedure: Procedure(s):  COLONOSCOPY    Medications prior to admission:   Prior to Admission medications    Not on File       Current medications:    No current facility-administered medications for this encounter.     No current outpatient medications on file.       Allergies:  No Known Allergies    Problem List:  There is no problem list on file for this patient.      Past Medical History:        Diagnosis Date    PTSD (post-traumatic stress disorder)     Ulcerative colitis (HCC)        Past Surgical History:  No past surgical history on file.    Social History:    Social History     Tobacco Use    Smoking status: Not on file    Smokeless tobacco: Not on file   Substance Use Topics    Alcohol use: Not on file                                Counseling given: Not Answered      Vital Signs (Current):   Vitals:    07/03/22 1039   Weight: 104.3 kg (230 lb)   Height: 1.803 m (5\' 11" )                                              BP Readings from Last 3 Encounters:   No data found for BP       NPO Status:                                                                                 BMI:   Wt Readings from Last 3 Encounters:   No data found for Wt     Body mass index is 32.08 kg/m.    CBC: No results found for: "WBC", "RBC", "HGB", "HCT", "MCV", "RDW", "PLT"    CMP: No results found for: "NA", "K", "CL", "CO2", "BUN", "CREATININE", "GFRAA", "AGRATIO", "LABGLOM", "GLUCOSE", "GLU", "PROT", "CALCIUM", "BILITOT", "ALKPHOS", "AST", "ALT"    POC Tests: No results for input(s): "POCGLU", "POCNA", "POCK", "POCCL", "POCBUN", "POCHEMO", "POCHCT" in the last 72 hours.    Coags: No results found for: "PROTIME", "INR", "APTT"    HCG (If Applicable): No results found for: "PREGTESTUR", "PREGSERUM",  "HCG", "HCGQUANT"     ABGs: No results found for: "PHART", "  PO2ART", "PCO2ART", "HCO3ART", "BEART", "O2SATART"     Type & Screen (If Applicable):  No results found for: "LABABO"    Drug/Infectious Status (If Applicable):  No results found for: "HIV", "HEPCAB"    COVID-19 Screening (If Applicable): No results found for: "COVID19"        Anesthesia Evaluation  Patient summary reviewed and Nursing notes reviewed  Airway: Mallampati: II  TM distance: >3 FB   Neck ROM: full  Mouth opening: > = 3 FB   Dental: normal exam         Pulmonary:Negative Pulmonary ROS and normal exam  breath sounds clear to auscultation                             Cardiovascular:Negative CV ROS            Rhythm: regular  Rate: normal                    Neuro/Psych:   (+) psychiatric history:depression/anxiety              ROS comment: PTSD GI/Hepatic/Renal:            ROS comment: Ulcerative Colitis.   Endo/Other: Negative Endo/Other ROS                    Abdominal:             Vascular: negative vascular ROS.         Other Findings:             Anesthesia Plan      MAC and TIVA     ASA 2       Induction: intravenous.  continuous noninvasive hemodynamic monitor    Anesthetic plan and risks discussed with patient.      Plan discussed with CRNA.    Attending anesthesiologist reviewed and agrees with Preprocedure content                Ronaldo Miyamoto, MD   07/11/2022

## 2022-07-13 ENCOUNTER — Inpatient Hospital Stay: Payer: PRIVATE HEALTH INSURANCE | Attending: Gastroenterology

## 2022-07-13 MED ORDER — LACTATED RINGERS IV SOLN
INTRAVENOUS | Status: DC
Start: 2022-07-13 — End: 2022-07-13

## 2022-07-13 MED ORDER — PROPOFOL 200 MG/20ML IV EMUL
200 | INTRAVENOUS | Status: AC
Start: 2022-07-13 — End: ?

## 2022-07-13 MED ORDER — PROPOFOL 200 MG/20ML IV EMUL
200 MG/20ML | INTRAVENOUS | Status: DC | PRN
  Administered 2022-07-13 (×2): 20 via INTRAVENOUS
  Administered 2022-07-13: 13:00:00 30 via INTRAVENOUS
  Administered 2022-07-13: 13:00:00 50 via INTRAVENOUS
  Administered 2022-07-13: 13:00:00 10 via INTRAVENOUS
  Administered 2022-07-13: 13:00:00 20 via INTRAVENOUS
  Administered 2022-07-13 (×2): 10 via INTRAVENOUS
  Administered 2022-07-13: 13:00:00 30 via INTRAVENOUS
  Administered 2022-07-13: 13:00:00 70 via INTRAVENOUS
  Administered 2022-07-13: 13:00:00 10 via INTRAVENOUS
  Administered 2022-07-13 (×2): 30 via INTRAVENOUS

## 2022-07-13 MED ORDER — LIDOCAINE HCL (PF) 2 % IJ SOLN
2 | INTRAMUSCULAR | Status: AC
Start: 2022-07-13 — End: ?

## 2022-07-13 MED ORDER — LACTATED RINGERS IV SOLN
INTRAVENOUS | Status: DC | PRN
  Administered 2022-07-13: 13:00:00 via INTRAVENOUS

## 2022-07-13 MED ORDER — LIDOCAINE HCL (PF) 2 % IJ SOLN
2 % | INTRAMUSCULAR | Status: DC | PRN
  Administered 2022-07-13: 13:00:00 40 via INTRAVENOUS

## 2022-07-13 MED ORDER — SODIUM CHLORIDE 0.9 % IV SOLN
0.9 | INTRAVENOUS | Status: DC
Start: 2022-07-13 — End: 2022-07-13

## 2022-07-13 MED FILL — SODIUM CHLORIDE 0.9 % IV SOLN: 0.9 % | INTRAVENOUS | Qty: 1000

## 2022-07-13 MED FILL — DIPRIVAN 200 MG/20ML IV EMUL: 200 MG/20ML | INTRAVENOUS | Qty: 20

## 2022-07-13 MED FILL — XYLOCAINE-MPF 2 % IJ SOLN: 2 % | INTRAMUSCULAR | Qty: 5

## 2022-07-13 MED FILL — LACTATED RINGERS IV SOLN: INTRAVENOUS | Qty: 1000

## 2022-07-13 NOTE — Progress Notes (Signed)
Attempted to call report to [facility] [place] [facility]. [phone]. No one will answer the phone. Discharge instructions and procedure report from Dr Smith Robert placed in envelope and given to officers.

## 2022-07-13 NOTE — Other (Signed)
Call report to [facility] [place] [facility] [phone] then ask for Medical

## 2022-07-13 NOTE — Anesthesia Post-Procedure Evaluation (Signed)
Department of Anesthesiology  Postprocedure Note    Patient: Tommy Woods  MRN: [id number]  Birthdate: 09/04/1980  Date of evaluation: 07/13/2022    Procedure Summary       Date: 07/13/22 Room / Location: [facility] / [facility]    Anesthesia Start: 0903 Anesthesia Stop: 0927    Procedure: COLONOSCOPY (Lower GI Region) Diagnosis:       Abdominal pain, unspecified abdominal location      (Abdominal pain, unspecified abdominal location [R10.9])    Surgeons: Skeet Simmer, MD Responsible Provider: Ronaldo Miyamoto, MD    Anesthesia Type: MAC ASA Status: 2            Anesthesia Type: MAC    Aldrete Phase I: Aldrete Score: 10    Aldrete Phase II:      Anesthesia Post Evaluation    Patient location during evaluation: bedside (Endoscopy Unit)  Patient participation: complete - patient participated  Level of consciousness: sleepy but conscious  Pain score: 0  Airway patency: patent  Nausea & Vomiting: no nausea and no vomiting  Cardiovascular status: hemodynamically stable  Respiratory status: acceptable  Hydration status: stable  Comments: This patient remained on the stretcher.  The patient was handed off to the endoscopy nursing team.  All questions regarding pre-, intra-, and postoperative care were answered.  Multimodal analgesia pain management approach    No notable events documented.
